# Patient Record
Sex: Female | Born: 1965 | Race: Black or African American | Hispanic: No | Smoking: Former smoker
Health system: Southern US, Community
[De-identification: ages and names within clinical notes are randomized; demographics above are authoritative.]

## PROBLEM LIST (undated history)

## (undated) ENCOUNTER — Inpatient Hospital Stay (INDEPENDENT_AMBULATORY_CARE_PROVIDER_SITE_OTHER): Payer: Medicare Other | Admitting: Podiatry

## (undated) ENCOUNTER — Emergency Department (HOSPITAL_COMMUNITY): Admission: EM | Payer: Medicare Other | Source: Home / Self Care

## (undated) DIAGNOSIS — Z87442 Personal history of urinary calculi: Secondary | ICD-10-CM

## (undated) DIAGNOSIS — M1990 Unspecified osteoarthritis, unspecified site: Secondary | ICD-10-CM

## (undated) DIAGNOSIS — I10 Essential (primary) hypertension: Secondary | ICD-10-CM

## (undated) DIAGNOSIS — K219 Gastro-esophageal reflux disease without esophagitis: Secondary | ICD-10-CM

## (undated) HISTORY — PX: ENDOMETRIAL ABLATION: SHX621

## (undated) HISTORY — PX: KNEE ARTHROSCOPY: SUR90

## (undated) HISTORY — PX: GASTROPLASTY DUODENAL SWITCH: SHX1699

## (undated) HISTORY — PX: TUBAL LIGATION: SHX77

---

## 2012-02-27 DIAGNOSIS — M51379 Other intervertebral disc degeneration, lumbosacral region without mention of lumbar back pain or lower extremity pain: Secondary | ICD-10-CM | POA: Insufficient documentation

## 2012-02-27 DIAGNOSIS — M5137 Other intervertebral disc degeneration, lumbosacral region: Secondary | ICD-10-CM | POA: Insufficient documentation

## 2014-06-10 DIAGNOSIS — M545 Low back pain, unspecified: Secondary | ICD-10-CM | POA: Insufficient documentation

## 2014-06-10 DIAGNOSIS — I10 Essential (primary) hypertension: Secondary | ICD-10-CM | POA: Insufficient documentation

## 2014-06-10 DIAGNOSIS — M25569 Pain in unspecified knee: Secondary | ICD-10-CM | POA: Insufficient documentation

## 2014-06-10 DIAGNOSIS — R5383 Other fatigue: Secondary | ICD-10-CM | POA: Insufficient documentation

## 2015-09-25 DIAGNOSIS — G4733 Obstructive sleep apnea (adult) (pediatric): Secondary | ICD-10-CM | POA: Insufficient documentation

## 2015-11-25 DIAGNOSIS — Z9884 Bariatric surgery status: Secondary | ICD-10-CM | POA: Insufficient documentation

## 2015-12-29 ENCOUNTER — Emergency Department (HOSPITAL_COMMUNITY): Payer: Medicare Other

## 2015-12-29 ENCOUNTER — Emergency Department (HOSPITAL_COMMUNITY)
Admission: EM | Admit: 2015-12-29 | Discharge: 2015-12-29 | Disposition: A | Discharge status: Home / Self Care | Payer: Medicare Other | Attending: Emergency Medicine | Admitting: Emergency Medicine

## 2015-12-29 ENCOUNTER — Encounter (HOSPITAL_COMMUNITY): Payer: Self-pay

## 2015-12-29 DIAGNOSIS — Z79899 Other long term (current) drug therapy: Secondary | ICD-10-CM | POA: Diagnosis not present

## 2015-12-29 DIAGNOSIS — N950 Postmenopausal bleeding: Secondary | ICD-10-CM | POA: Diagnosis not present

## 2015-12-29 DIAGNOSIS — R197 Diarrhea, unspecified: Secondary | ICD-10-CM | POA: Diagnosis present

## 2015-12-29 DIAGNOSIS — R1031 Right lower quadrant pain: Secondary | ICD-10-CM | POA: Diagnosis not present

## 2015-12-29 DIAGNOSIS — N83201 Unspecified ovarian cyst, right side: Secondary | ICD-10-CM | POA: Diagnosis not present

## 2015-12-29 DIAGNOSIS — J111 Influenza due to unidentified influenza virus with other respiratory manifestations: Secondary | ICD-10-CM | POA: Diagnosis not present

## 2015-12-29 DIAGNOSIS — Z3202 Encounter for pregnancy test, result negative: Secondary | ICD-10-CM | POA: Insufficient documentation

## 2015-12-29 DIAGNOSIS — Z87891 Personal history of nicotine dependence: Secondary | ICD-10-CM | POA: Insufficient documentation

## 2015-12-29 DIAGNOSIS — R69 Illness, unspecified: Secondary | ICD-10-CM

## 2015-12-29 LAB — URINALYSIS, ROUTINE W REFLEX MICROSCOPIC
BILIRUBIN URINE: NEGATIVE
GLUCOSE, UA: NEGATIVE mg/dL
KETONES UR: NEGATIVE mg/dL
Leukocytes, UA: NEGATIVE
NITRITE: NEGATIVE
PH: 8 (ref 5.0–8.0)
Protein, ur: NEGATIVE mg/dL
Specific Gravity, Urine: 1.013 (ref 1.005–1.030)

## 2015-12-29 LAB — URINE MICROSCOPIC-ADD ON

## 2015-12-29 LAB — COMPREHENSIVE METABOLIC PANEL
ALBUMIN: 3.5 g/dL (ref 3.5–5.0)
ALK PHOS: 61 U/L (ref 38–126)
ALT: 25 U/L (ref 14–54)
AST: 39 U/L (ref 15–41)
Anion gap: 8 (ref 5–15)
BILIRUBIN TOTAL: 1 mg/dL (ref 0.3–1.2)
BUN: 7 mg/dL (ref 6–20)
CO2: 25 mmol/L (ref 22–32)
CREATININE: 0.57 mg/dL (ref 0.44–1.00)
Calcium: 8.7 mg/dL — ABNORMAL LOW (ref 8.9–10.3)
Chloride: 103 mmol/L (ref 101–111)
GFR calc Af Amer: >60 mL/min (ref >60)
GFR calc non Af Amer: >60 mL/min (ref >60)
GLUCOSE: 89 mg/dL (ref 65–99)
POTASSIUM: 3.9 mmol/L (ref 3.5–5.1)
Sodium: 136 mmol/L (ref 135–145)
TOTAL PROTEIN: 7.9 g/dL (ref 6.5–8.1)

## 2015-12-29 LAB — CBC
HEMATOCRIT: 37.8 % (ref 36.0–46.0)
Hemoglobin: 12.5 g/dL (ref 12.0–15.0)
MCH: 26.4 pg (ref 26.0–34.0)
MCHC: 33.1 g/dL (ref 30.0–36.0)
MCV: 79.9 fL (ref 78.0–100.0)
Platelets: 230 10*3/uL (ref 150–400)
RBC: 4.73 MIL/uL (ref 3.87–5.11)
RDW: 16.9 % — AB (ref 11.5–15.5)
WBC: 5.9 10*3/uL (ref 4.0–10.5)

## 2015-12-29 LAB — I-STAT BETA HCG BLOOD, ED (MC, WL, AP ONLY): I-stat hCG, quantitative: <5 m[IU]/mL (ref <5)

## 2015-12-29 LAB — POC OCCULT BLOOD, ED: FECAL OCCULT BLD: NEGATIVE

## 2015-12-29 LAB — LIPASE, BLOOD: Lipase: 33 U/L (ref 11–51)

## 2015-12-29 MED ORDER — IOHEXOL 300 MG/ML  SOLN
100.0000 mL | Freq: Once | INTRAMUSCULAR | Status: AC | PRN
  Administered 2015-12-29: 100 mL via INTRAVENOUS

## 2015-12-29 MED ORDER — ONDANSETRON HCL 4 MG PO TABS
4.0000 mg | ORAL_TABLET | Freq: Three times a day (TID) | ORAL | Status: DC | PRN
Start: 2015-12-29 — End: 2017-08-22

## 2015-12-29 MED ORDER — ACETAMINOPHEN 325 MG PO TABS
650.0000 mg | ORAL_TABLET | Freq: Once | ORAL | Status: AC
  Administered 2015-12-29: 650 mg via ORAL
  Filled 2015-12-29: qty 2

## 2015-12-29 MED ORDER — SODIUM CHLORIDE 0.9 % IV BOLUS (SEPSIS)
1000.0000 mL | Freq: Once | INTRAVENOUS | Status: AC
  Administered 2015-12-29: 1000 mL via INTRAVENOUS

## 2015-12-29 MED ORDER — ONDANSETRON HCL 4 MG/2ML IJ SOLN
4.0000 mg | Freq: Once | INTRAMUSCULAR | Status: AC
  Administered 2015-12-29: 4 mg via INTRAVENOUS
  Filled 2015-12-29: qty 2

## 2015-12-29 MED ORDER — OSELTAMIVIR PHOSPHATE 75 MG PO CAPS: 75.0000 mg | ORAL_CAPSULE | Freq: Two times a day (BID) | ORAL | Status: DC

## 2015-12-29 MED ORDER — IOHEXOL 300 MG/ML  SOLN
25.0000 mL | Freq: Once | INTRAMUSCULAR | Status: AC | PRN
  Administered 2015-12-29: 25 mL via ORAL

## 2015-12-29 MED ORDER — MORPHINE SULFATE (PF) 4 MG/ML IV SOLN: 4.0000 mg | Freq: Once | INTRAVENOUS | Status: DC

## 2015-12-29 NOTE — ED Provider Notes (Signed)
CSN: [id number]     Arrival date & time 12/29/15  1052 History   First MD Initiated Contact with Patient 12/29/15 1129     Chief Complaint  Patient presents with  . Vaginal Bleeding  . Diarrhea     (Consider location/radiation/quality/duration/timing/severity/associated sxs/prior Treatment) HPI 50 year old female who presents with vaginal bleeding and diarrhea. History of gastroplasty duodenal switch in 09/2015 in [place]. She was helping a friend move three days ago and 2 days ago developed cough, subj fever, myalgias, congestion, sore throat. Then developed loose stool abdominal pain yesterday. Noted having vaginal bleeding yesterday as well. No rectal bleeding or hematochezia, no dysuria or frequency. Pain primarily over right lower abdomen she states. With nausea and one episode of vomiting. Unknown sick contacts. No recent travel. No chest pain or dyspnea.   History reviewed. No pertinent past medical history. Past Surgical History  Procedure Laterality Date  . Gastroplasty duodenal switch    . Knee arthroscopy    . Endometrial ablation    . Tubal ligation     History reviewed. No pertinent family history. Social History  Substance Use Topics  . Smoking status: Former Games developer  . Smokeless tobacco: None  . Alcohol Use: No   OB History    No data available     Review of Systems 10/14 systems reviewed and are negative other than those stated in the HPI    Allergies  Sulfa antibiotics  Home Medications   Prior to Admission medications   Medication Sig Start Date End Date Taking? Authorizing Provider  acetaminophen (TYLENOL) 500 MG tablet Take 500 mg by mouth every 6 (six) hours as needed for mild pain.   Yes Historical Provider, MD  Chlorphen-Pseudoephed-APAP (THERAFLU FLU/COLD PO) Take 1 tablet by mouth 2 (two) times daily as needed (cold symptoms).   Yes Historical Provider, MD  Cholecalciferol (VITAMIN D PO) Take 1 tablet by mouth daily.   Yes Historical Provider, MD   Cyanocobalamin (VITAMIN B-12 PO) Take 1 tablet by mouth daily.   Yes Historical Provider, MD  IRON PO Take 1 tablet by mouth at bedtime.   Yes Historical Provider, MD  Multiple Vitamin (MULTIVITAMIN WITH MINERALS) TABS tablet Take 1 tablet by mouth 3 (three) times daily.   Yes Historical Provider, MD  ondansetron (ZOFRAN) 4 MG tablet Take 1 tablet (4 mg total) by mouth every 8 (eight) hours as needed for nausea or vomiting. 12/29/15   Lavera Guise, MD  oseltamivir (TAMIFLU) 75 MG capsule Take 1 capsule (75 mg total) by mouth every 12 (twelve) hours. 12/29/15   Lavera Guise, MD   BP 138/63 mmHg  Pulse 95  Temp(Src) 103 F (39.4 C) (Oral)  Resp 15  SpO2 98% Physical Exam Physical Exam  Nursing note and vitals reviewed. Constitutional: Appears tired but non-toxic and in no acute distress. Head: Normocephalic and atraumatic.  Mouth/Throat: Oropharynx is clear, but moist.  Neck: Normal range of motion. Neck supple.  Cardiovascular: Normal rate and regular rhythm.  No edema. Pulmonary/Chest: Effort normal and breath sounds normal.  Abdominal: Soft. There is minimal RLQ tenderness. There is no rebound and no guarding.  Pelvic: Normal external genitalia. Normal internal genitalia. No discharge. There is blood within the vagina, but no significant active bleeding from cervix. No cervical motion tenderness. No adnexal masses or tenderness. Musculoskeletal: Normal range of motion.  Neurological: Alert, no facial droop, fluent speech, moves all extremities symmetrically Skin: Skin is warm and dry.  Psychiatric: Cooperative  ED  Course  Procedures (including critical care time) Labs Review Labs Reviewed  COMPREHENSIVE METABOLIC PANEL - Abnormal; Notable for the following:    Calcium 8.7 (*)    All other components within normal limits  CBC - Abnormal; Notable for the following:    RDW 16.9 (*)    All other components within normal limits  URINALYSIS, ROUTINE W REFLEX MICROSCOPIC (NOT AT [facility]) -  Abnormal; Notable for the following:    APPearance CLOUDY (*)    Hgb urine dipstick LARGE (*)    All other components within normal limits  URINE MICROSCOPIC-ADD ON - Abnormal; Notable for the following:    Squamous Epithelial / LPF 0-5 (*)    Bacteria, UA RARE (*)    All other components within normal limits  LIPASE, BLOOD  I-STAT BETA HCG BLOOD, ED ([facility], [facility], [facility] ONLY)  POC OCCULT BLOOD, ED    Imaging Review Dg Chest 2 View  12/29/2015  CLINICAL DATA:  Cough.  Abdominal pain and diarrhea for 3 days EXAM: CHEST  2 VIEW COMPARISON:  None. FINDINGS: Lungs are clear. Heart is borderline enlarged with pulmonary vascularity within normal limits. No adenopathy. No bone lesions. There are surgical clips in the upper abdomen. IMPRESSION: Heart borderline enlarged.  No edema or consolidation. Electronically Signed   By: Bretta Bang III M.D.   On: 12/29/2015 13:43   Ct Abdomen Pelvis W Contrast  12/29/2015  CLINICAL DATA:  Generalized abdominal pain, soreness, diarrhea, nausea and vomiting for 3 days. Initial encounter. EXAM: CT ABDOMEN AND PELVIS WITH CONTRAST TECHNIQUE: Multidetector CT imaging of the abdomen and pelvis was performed using the standard protocol following bolus administration of intravenous contrast. CONTRAST:  100 ml OMNIPAQUE IOHEXOL 300 MG/ML SOLN, 25 ml OMNIPAQUE IOHEXOL 300 MG/ML SOLN COMPARISON:  None. FINDINGS: The lung bases are clear. No pleural or pericardial effusion. Heart size is upper normal. The patient is status post gastric bypass without evidence of complication. The stomach and small bowel are otherwise unremarkable. The colon and appendix appear normal. A cystic lesion in the right ovary measures 5.7 x 3.4 cm. The uterus and left ovary are unremarkable. The gallbladder has been removed. The liver, spleen, adrenal glands, pancreas and biliary tree are unremarkable. Two cysts are identified in each kidney. A punctate nonobstructing stone is seen in the lower pole of the  left kidney. The kidneys are otherwise unremarkable. Aortoiliac atherosclerosis without aneurysm is identified. Small fat containing umbilical hernia is seen. There is no lymphadenopathy or fluid. Facet degenerative change is seen in the lumbar spine. No worrisome bony lesion is identified. IMPRESSION: No acute abnormality or finding to explain the patient's symptoms. 5.7 x 3.4 cm right ovarian cyst. Follow-up ultrasound in 6-12 weeks is recommended further evaluation. This recommendation follows ACR consensus guidelines: White Paper of the ACR Incidental Findings Committee II on Adnexal Findings. J Am Coll Radiol [phone]. Atherosclerosis. Punctate nonobstructing stone lower pole left kidney. Small fat containing umbilical hernia. Status post gastric bypass. Electronically Signed   By: Drusilla Kanner M.D.   On: 12/29/2015 14:43   I have personally reviewed and evaluated these images and lab results as part of my medical decision-making.   EKG Interpretation None      MDM   Final diagnoses:  Influenza-like illness  Cyst of right ovary  Post-menopausal bleeding   50 year old female who presents with influenza like illness with new onset post menopausal bleeding and abdominal pain.   Does have fever while in ED, but  no other systemic signs of illness. NO tachycardia or hypotension or respiratory distress. Normal WBC and no major metabolic or electrolyte derangements. CXR without infiltrate or other processes and UA unremarkable. No antibiotics warranted as likely flu or viral illness. GI symptoms may be related to her illness, but does have mild pain in RLQ. CT was performed of abdomen in setting of recent surgery as well, and visualized. Negative for acute intraabdominal process but does not right ovarian cyst which may have caused her discomfort. Within window for tamiflu given likely flu and given prescription. Do not feel antibiotics are indicated at this time.  In regards to vaginal  bleeding, post menopausal in nature. Blood in vaginal but no significant active bleeding and hgb stable. Right ovarian cyst on CT without c/f torsion given benign abdominal exam. No other acute pelvic processes on CT. Discussed need for close gyn follow-up to rule out endometrial polyp, malignancy, or other process. Given warning signs for worsening bleeding that should prompt return to ED.   At this time appropriate for discharge home with outpatient follow-up. Strict return and follow-up instructions reviewed. She expressed understanding of all discharge instructions and felt comfortable with the plan of care.    Lavera Guise, MD 12/29/15 2106

## 2015-12-29 NOTE — Discharge Instructions (Signed)
In regards to your vaginal bleeding, please call your gynecologist tomorrow and follow-up regarding further work-up. Return if worsening bleeding or abdominal pain (> 1 pad per hour of bleeding).  Your CXR does not show pneumonia and you do not require antibiotics. You likely have flu like illness. Return for worsening symptoms, including confusion, vomiting and unable to keep down food/fluids, difficulty breathing, or any other symptoms concerning to you.  Influenza, Adult Influenza (flu) is an infection in the mouth, nose, and throat (respiratory tract) caused by a virus. The flu can make you feel very ill. Influenza spreads easily from person to person (contagious).  HOME CARE   Only take medicines as told by your doctor.  Use a cool mist humidifier to make breathing easier.  Get plenty of rest until your fever goes away. This usually takes 3 to 4 days.  Drink enough fluids to keep your pee (urine) clear or pale yellow.  Cover your mouth and nose when you cough or sneeze.  Wash your hands well to avoid spreading the flu.  Stay home from work or school until your fever has been gone for at least 1 full day.  Get a flu shot every year. GET HELP RIGHT AWAY IF:   You have trouble breathing or feel short of breath.  Your skin or nails turn blue.  You have severe neck pain or stiffness.  You have a severe headache, facial pain, or earache.  Your fever gets worse or keeps coming back.  You feel sick to your stomach (nauseous), throw up (vomit), or have watery poop (diarrhea).  You have chest pain.  You have a deep cough that gets worse, or you cough up more thick spit (mucus). MAKE SURE YOU:   Understand these instructions.  Will watch your condition.  Will get help right away if you are not doing well or get worse.   This information is not intended to replace advice given to you by your health care provider. Make sure you discuss any questions you have with your health  care provider.   Document Released: 07/25/2008 Document Revised: 11/06/2014 Document Reviewed: 01/15/2012 Elsevier Interactive Patient Education 2016 [employer].  Postmenopausal Bleeding Postmenopausal bleeding is any bleeding a woman has after she has entered into menopause. Menopause is the end of a woman's fertile years. After menopause, a woman no longer ovulates or has menstrual periods.  Postmenopausal bleeding can be caused by various things. Any type of postmenopausal bleeding, even if it appears to be a typical menstrual period, is concerning. This should be evaluated by your health care provider. Any treatment will depend on the cause of the bleeding. HOME CARE INSTRUCTIONS Monitor your condition for any changes. The following actions may help to alleviate any discomfort you are experiencing:  Avoid the use of tampons and douches as directed by your health care provider.  Change your pads frequently.  Get regular pelvic exams and Pap tests.  Keep all follow-up appointments for diagnostic tests as directed by your health care provider. SEEK MEDICAL CARE IF:   Your bleeding lasts more than 1 week.  You have abdominal pain.  You have bleeding with sexual intercourse. SEEK IMMEDIATE MEDICAL CARE IF:   You have a fever, chills, headache, dizziness, muscle aches, and bleeding.  You have severe pain with bleeding.  You are passing blood clots.  You have bleeding and need more than 1 pad an hour.  You feel faint. MAKE SURE YOU:  Understand these instructions.  Will  watch your condition.  Will get help right away if you are not doing well or get worse.   This information is not intended to replace advice given to you by your health care provider. Make sure you discuss any questions you have with your health care provider.   Document Released: 01/24/2006 Document Revised: 08/06/2013 Document Reviewed: 05/15/2013 Elsevier Interactive Patient Education [employer].

## 2015-12-29 NOTE — ED Notes (Addendum)
Pt c/o abdominal soreness and diarrhea x 3 days and vaginal bleeding x 2 days.  Pain score 8/10.  Pt reports "bariatric surgery" in December 2016 at a hospital in [facility].  Last menstrual cycle x 7 years ago.

## 2017-03-08 IMAGING — CR DG CHEST 2V
2 series · 2 of 2 positions shown · non-contrast
Comparison: None.

CLINICAL DATA: Cough.  Abdominal pain and diarrhea for 3 days

EXAM:
CHEST  2 VIEW

[x chest ap]
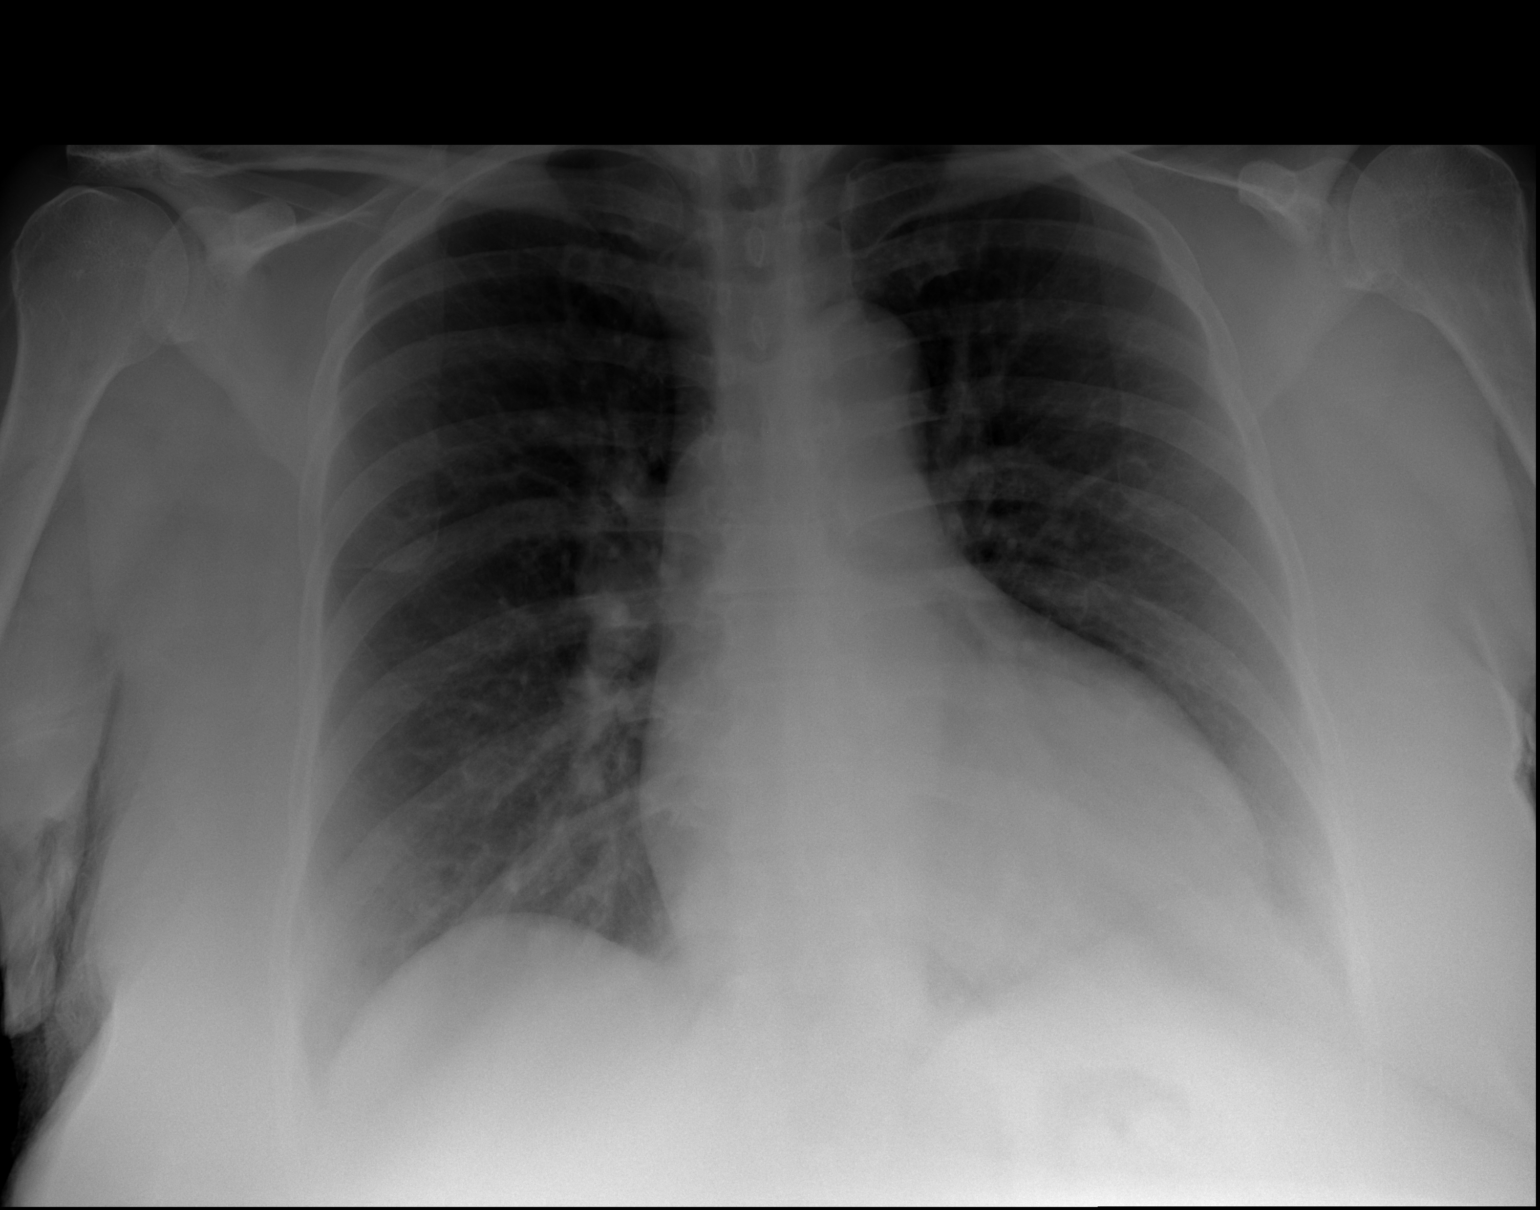

[w chest lat]
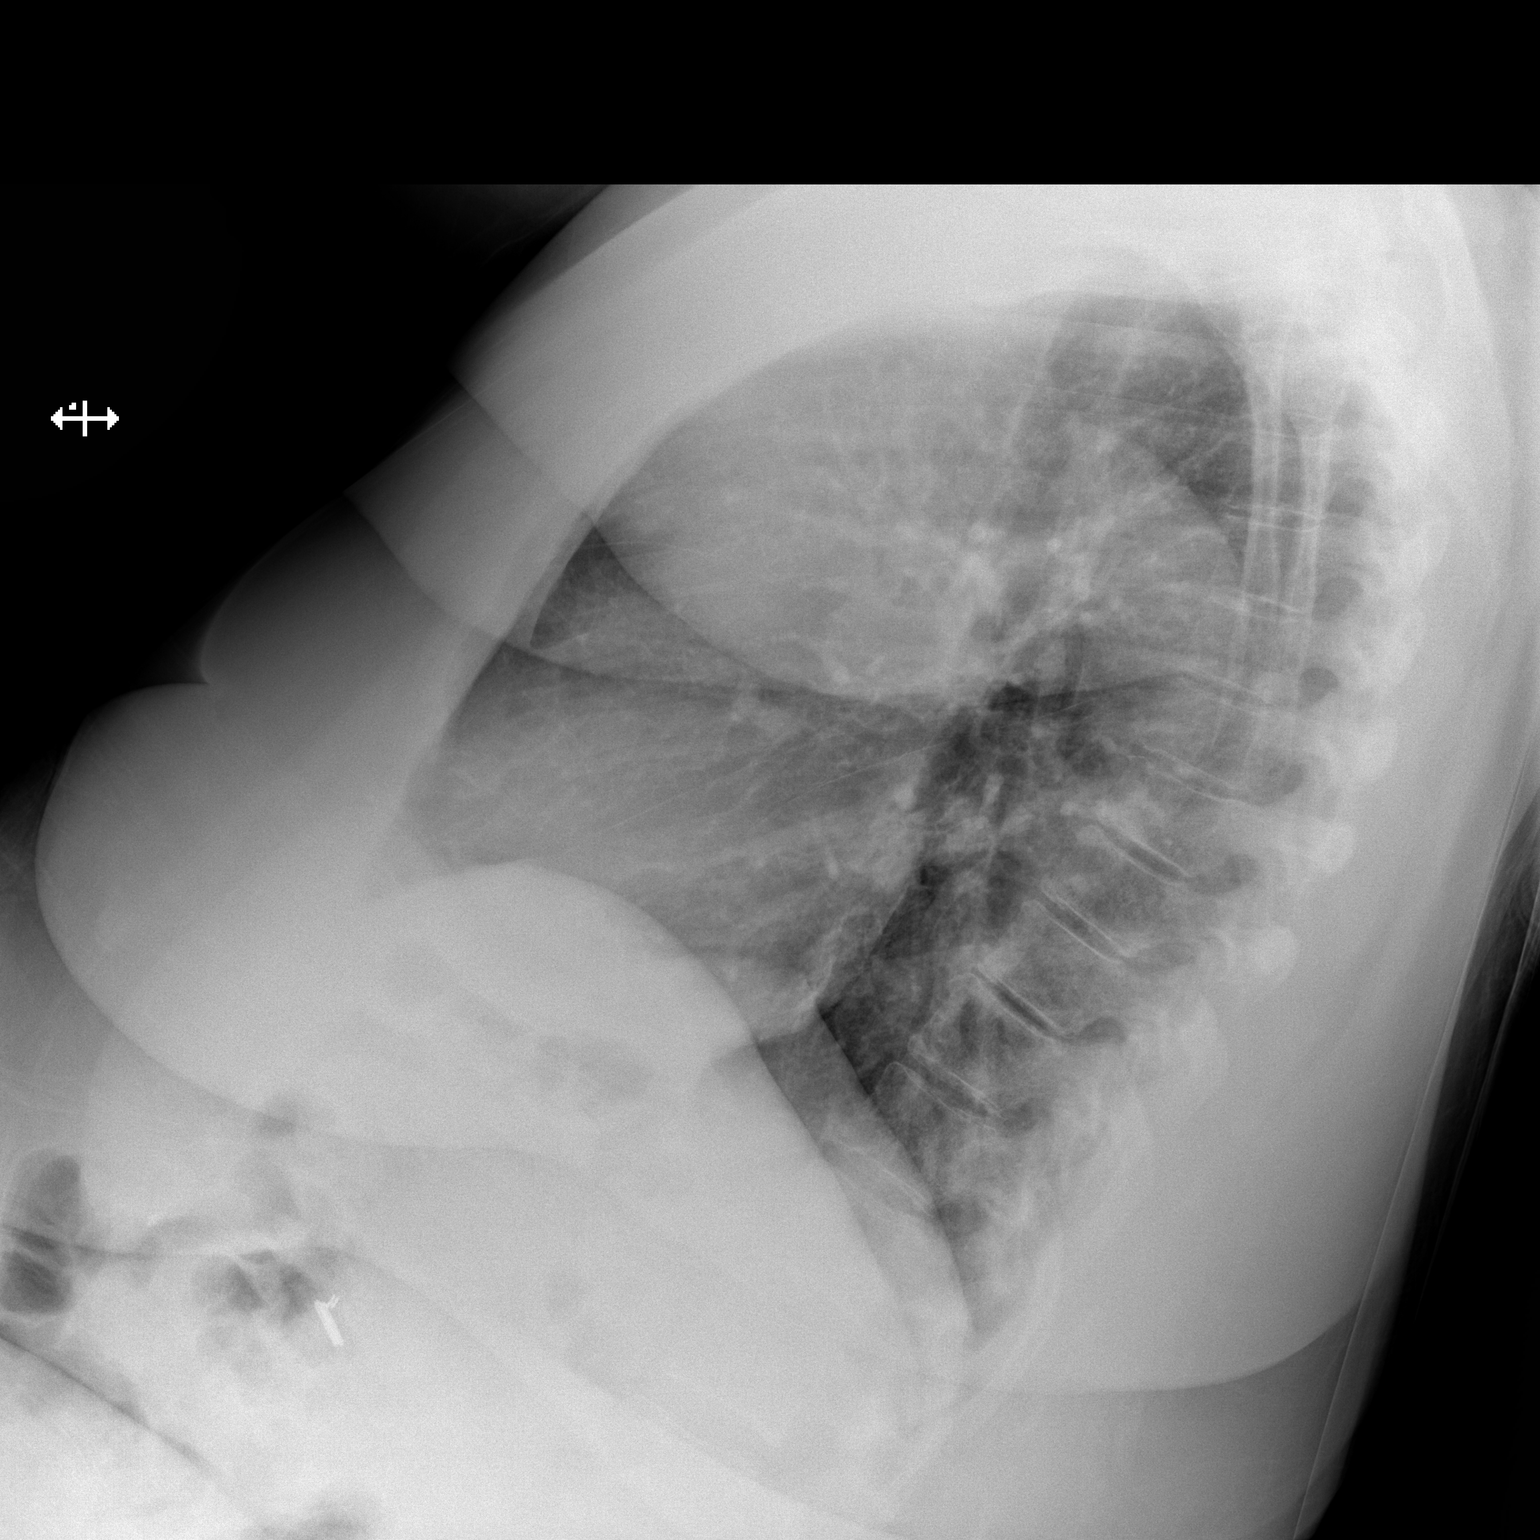

[2 of 2 positions shown; findings below may reference images not displayed]

FINDINGS: Lungs are clear. Heart is borderline enlarged with pulmonary
vascularity within normal limits. No adenopathy. No bone lesions.
There are surgical clips in the upper abdomen.
IMPRESSION: Heart borderline enlarged.  No edema or consolidation.

## 2017-08-22 ENCOUNTER — Encounter (HOSPITAL_BASED_OUTPATIENT_CLINIC_OR_DEPARTMENT_OTHER): Payer: Self-pay | Admitting: *Deleted

## 2017-08-22 ENCOUNTER — Ambulatory Visit (INDEPENDENT_AMBULATORY_CARE_PROVIDER_SITE_OTHER): Payer: Medicare Other

## 2017-08-22 ENCOUNTER — Ambulatory Visit (INDEPENDENT_AMBULATORY_CARE_PROVIDER_SITE_OTHER): Payer: Medicare Other | Admitting: Podiatry

## 2017-08-22 VITALS — BP 105/70 | HR 62 | Ht 64.0 in | Wt 214.0 lb

## 2017-08-22 DIAGNOSIS — M2042 Other hammer toe(s) (acquired), left foot: Secondary | ICD-10-CM | POA: Diagnosis not present

## 2017-08-22 DIAGNOSIS — M21619 Bunion of unspecified foot: Secondary | ICD-10-CM | POA: Diagnosis not present

## 2017-08-22 DIAGNOSIS — M79672 Pain in left foot: Principal | ICD-10-CM

## 2017-08-22 DIAGNOSIS — M2041 Other hammer toe(s) (acquired), right foot: Secondary | ICD-10-CM | POA: Diagnosis not present

## 2017-08-22 DIAGNOSIS — M21969 Unspecified acquired deformity of unspecified lower leg: Secondary | ICD-10-CM | POA: Diagnosis not present

## 2017-08-22 DIAGNOSIS — M2010 Hallux valgus (acquired), unspecified foot: Secondary | ICD-10-CM

## 2017-08-22 DIAGNOSIS — M779 Enthesopathy, unspecified: Secondary | ICD-10-CM

## 2017-08-22 DIAGNOSIS — M79671 Pain in right foot: Secondary | ICD-10-CM

## 2017-08-22 NOTE — Patient Instructions (Addendum)
Pre-Operative Instructions  Congratulations, you have decided to take an important step towards improving your quality of life.  You can be assured that the doctors and staff at [facility] will be with you every step of the way.  Here are some important things you should know:  1. Plan to be at the surgery center/hospital at least 1 (one) hour prior to your scheduled time, unless otherwise directed by the surgical center/hospital staff.  You must have a responsible adult accompany you, remain during the surgery and drive you home.  Make sure you have directions to the surgical center/hospital to ensure you arrive on time. 2. If you are having surgery at [facility] or [facility] hospitals, you will need a copy of your medical history and physical form from your family physician within one month prior to the date of surgery. We will give you a form for your primary physician to complete.  3. We make every effort to accommodate the date you request for surgery.  However, there are times where surgery dates or times have to be moved.  We will contact you as soon as possible if a change in schedule is required.   4. No aspirin/ibuprofen for one week before surgery.  If you are on aspirin, any non-steroidal anti-inflammatory medications (Mobic, Aleve, Ibuprofen) should not be taken seven (7) days prior to your surgery.  You make take Tylenol for pain prior to surgery.  5. Medications - If you are taking daily heart and blood pressure medications, seizure, reflux, allergy, asthma, anxiety, pain or diabetes medications, make sure you notify the surgery center/hospital before the day of surgery so they can tell you which medications you should take or avoid the day of surgery. 6. No food or drink after midnight the night before surgery unless directed otherwise by surgical center/hospital staff. 7. No alcoholic beverages 24-hours prior to surgery.  No smoking 24-hours prior or 24-hours after  surgery. 8. Wear loose pants or shorts. They should be loose enough to fit over bandages, boots, and casts. 9. Don't wear slip-on shoes. Sneakers are preferred. 10. Bring your boot with you to the surgery center/hospital.  Also bring crutches or a walker if your physician has prescribed it for you.  If you do not have this equipment, it will be provided for you after surgery. 11. If you have not been contacted by the surgery center/hospital by the day before your surgery, call to confirm the date and time of your surgery. 12. Leave-time from work may vary depending on the type of surgery you have.  Appropriate arrangements should be made prior to surgery with your employer. 13. Prescriptions will be provided immediately following surgery by your doctor.  Fill these as soon as possible after surgery and take the medication as directed. Pain medications will not be refilled on weekends and must be approved by the doctor. 14. Remove nail polish on the operative foot and avoid getting pedicures prior to surgery. 15. Wash the night before surgery.  The night before surgery wash the foot and leg well with water and the antibacterial soap provided. Be sure to pay special attention to beneath the toenails and in between the toes.  Wash for at least three (3) minutes. Rinse thoroughly with water and dry well with a towel.  Perform this wash unless told not to do so by your physician.  Enclosed: 1 Ice pack (please put in freezer the night before surgery)   1 Hibiclens skin cleaner     Pre-op instructions  If you have any questions regarding the instructions, please do not hesitate to call our office.  [place]: [address], [place],  [place] [address] -- [phone]  [place]: [address] [place], [place] [address] -- [phone]  [place]: [address]  [place], [place] [address] -- [phone]  [place]: [address], [address], [place], [place] [address] -- [phone]  Website:  [email or web address] 

## 2017-08-22 NOTE — Progress Notes (Signed)
Subjective:    Patient ID: Julie Douglas, female    DOB: 08/11/1966, 51 y.o.   MRN: [id number]  HPI  Chief Complaint  Patient presents with  . Foot Pain    bilateral foot pain, toes and ball of foot   51 y.o. female presents with the above complaint.  Reports pain to both feet several months duration.  Lost a significant amount of weight after bariatric surgery is been unable to exercise recently due to foot pain and has gained weight back. Endorses pain to both feet sore in nature worse when ambulating.  Reports callus on the ball of both feet. Tried padding and shoe gear change without relief. Denies diabetes.   Past Medical History:  Diagnosis Date  . Hypertension    Past Surgical History:  Procedure Laterality Date  . ENDOMETRIAL ABLATION    . GASTROPLASTY DUODENAL SWITCH    . KNEE ARTHROSCOPY    . TUBAL LIGATION      Current Outpatient Prescriptions:  .  acetaminophen (TYLENOL) 500 MG tablet, Take 500 mg by mouth every 6 (six) hours as needed for mild pain., Disp: , Rfl:  .  Cholecalciferol (VITAMIN D PO), Take 1 tablet by mouth daily., Disp: , Rfl:  .  Cyanocobalamin (VITAMIN B-12 PO), Take 1 tablet by mouth daily., Disp: , Rfl:  .  ibuprofen (ADVIL,MOTRIN) 800 MG tablet, Take 800 mg by mouth every 8 (eight) hours as needed., Disp: , Rfl:  .  IRON PO, Take 1 tablet by mouth at bedtime., Disp: , Rfl:  .  lisinopril-hydrochlorothiazide (PRINZIDE,ZESTORETIC) 10-12.5 MG tablet, Take 1 tablet by mouth daily., Disp: , Rfl:  .  Multiple Vitamin (MULTIVITAMIN WITH MINERALS) TABS tablet, Take 1 tablet by mouth 3 (three) times daily., Disp: , Rfl:  .  omeprazole (PRILOSEC) 20 MG capsule, Take 20 mg by mouth daily., Disp: , Rfl:   Allergies  Allergen Reactions  . Sulfa Antibiotics Other (See Comments)    Severe headache     Review of Systems  Constitutional: Positive for activity change.  All other systems reviewed and are negative.     Objective:   Physical Exam Vitals:   08/22/17 0901  BP: 105/70  Pulse: 62   General AA&O x3. Normal mood and affect.  Vascular Dorsalis pedis and posterior tibial pulses  present 2+ bilaterally. Capillary refill normal to all digits. Pedal hair growth normal.  Neurologic Epicritic sensation grossly intact.  Dermatologic No open lesions. Interspaces clear of maceration.  Normal skin temperature and turgor. Hyperkeratotic lesions: none bilaterally  Orthopedic: MMT 5/5 in dorsiflexion, plantarflexion, inversion, and eversion. Hallux abductovalgus deformity present - bilat Left 1st MPJ diminished range of motion. Left 1st TMT without gross hypermobility. Right 1st MPJ diminished range of motion  Right 1st TMT without gross hypermobility. Lesser digital contractures present bilaterally. 5th adductovarus bilat.   Radiographs: Taken and reviewed. Hallux abductovalgus deformity present bilat. Metatarsal parabola abnormal - elongated 2nd metatarsal bilat. 1st/2nd IMA: moderately increased bilat. HA angle increased bilat. Dorsal osteophyte formation bilat. No acute fractures or dislocations.    Assessment & Plan:  Patient was evaluated and treated and all questions answered  HAV with bunion, plantar flexed metatarsal, fifth toe adductovarus deformity bilateral -XRs reviewed as above. -Will proceed with right bunion correction via double osteotomy, 2nd metatarsal shortening osteotomy, 5th toe hammertoe correction. -All risks benefits and alternatives of the above surgery explained.  No guarantees given. -Surgery to be scheduled at a date determined by the surgical coordinator.  F/u Post-operatively

## 2017-08-22 NOTE — Progress Notes (Signed)
Patient states she has appointment with PCP this Friday, 10/26 and has the H & P form from Dr. Samuella CotaPrice.  She will have them fax it to us.

## 2017-08-24 ENCOUNTER — Telehealth: Payer: Self-pay | Admitting: *Deleted

## 2017-08-24 NOTE — Telephone Encounter (Signed)
"  I'm scheduled for surgery on next week.  I was given a physical form that I needed to have filled out by my primary care doctor.  I am here for my appointment now.  I forgot the forms you gave me.  Can you fax the forms to my doctor's office."  Yes, I can.  "The fax number is [phone]."  I faxed the forms to patient's doctor's office as requested.

## 2017-08-24 NOTE — Telephone Encounter (Signed)
"  Dr Samuella CotaPrice has a case over here on Wednesday, October 31.  I just need the orders put in on that case.  Thank you."

## 2017-08-27 ENCOUNTER — Encounter (HOSPITAL_BASED_OUTPATIENT_CLINIC_OR_DEPARTMENT_OTHER)
Admission: RE | Admit: 2017-08-27 | Discharge: 2017-08-27 | Disposition: A | Discharge status: Home / Self Care | Payer: Medicare Other | Source: Ambulatory Visit | Attending: Podiatry | Admitting: Podiatry

## 2017-08-27 DIAGNOSIS — M2041 Other hammer toe(s) (acquired), right foot: Secondary | ICD-10-CM | POA: Diagnosis not present

## 2017-08-27 DIAGNOSIS — Z87891 Personal history of nicotine dependence: Secondary | ICD-10-CM | POA: Diagnosis not present

## 2017-08-27 DIAGNOSIS — M205X1 Other deformities of toe(s) (acquired), right foot: Secondary | ICD-10-CM | POA: Diagnosis not present

## 2017-08-27 DIAGNOSIS — I10 Essential (primary) hypertension: Secondary | ICD-10-CM | POA: Diagnosis not present

## 2017-08-27 DIAGNOSIS — Z79899 Other long term (current) drug therapy: Secondary | ICD-10-CM | POA: Diagnosis not present

## 2017-08-27 DIAGNOSIS — M21611 Bunion of right foot: Secondary | ICD-10-CM | POA: Diagnosis not present

## 2017-08-27 LAB — BASIC METABOLIC PANEL
Anion gap: 7 (ref 5–15)
BUN: 14 mg/dL (ref 6–20)
CALCIUM: 8.7 mg/dL — AB (ref 8.9–10.3)
CO2: 26 mmol/L (ref 22–32)
Chloride: 110 mmol/L (ref 101–111)
Creatinine, Ser: 0.76 mg/dL (ref 0.44–1.00)
GFR calc Af Amer: >60 mL/min (ref >60)
Glucose, Bld: 89 mg/dL (ref 65–99)
POTASSIUM: 3.4 mmol/L — AB (ref 3.5–5.1)
SODIUM: 143 mmol/L (ref 135–145)

## 2017-08-29 ENCOUNTER — Telehealth: Payer: Self-pay | Admitting: *Deleted

## 2017-08-29 ENCOUNTER — Ambulatory Visit (HOSPITAL_BASED_OUTPATIENT_CLINIC_OR_DEPARTMENT_OTHER): Payer: Medicare Other | Admitting: Anesthesiology

## 2017-08-29 ENCOUNTER — Encounter (HOSPITAL_BASED_OUTPATIENT_CLINIC_OR_DEPARTMENT_OTHER)
Admission: RE | Disposition: A | Discharge status: Home / Self Care | Payer: Self-pay | Source: Ambulatory Visit | Attending: Podiatry

## 2017-08-29 ENCOUNTER — Encounter (HOSPITAL_BASED_OUTPATIENT_CLINIC_OR_DEPARTMENT_OTHER): Payer: Self-pay | Admitting: Anesthesiology

## 2017-08-29 ENCOUNTER — Ambulatory Visit (HOSPITAL_BASED_OUTPATIENT_CLINIC_OR_DEPARTMENT_OTHER)
Admission: RE | Admit: 2017-08-29 | Discharge: 2017-08-29 | Disposition: A | Discharge status: Home / Self Care | Payer: Medicare Other | Source: Ambulatory Visit | Attending: Podiatry | Admitting: Podiatry

## 2017-08-29 DIAGNOSIS — M2041 Other hammer toe(s) (acquired), right foot: Secondary | ICD-10-CM | POA: Insufficient documentation

## 2017-08-29 DIAGNOSIS — M2011 Hallux valgus (acquired), right foot: Secondary | ICD-10-CM | POA: Diagnosis not present

## 2017-08-29 DIAGNOSIS — M21611 Bunion of right foot: Secondary | ICD-10-CM | POA: Diagnosis not present

## 2017-08-29 DIAGNOSIS — I10 Essential (primary) hypertension: Secondary | ICD-10-CM | POA: Diagnosis not present

## 2017-08-29 DIAGNOSIS — M7751 Other enthesopathy of right foot: Secondary | ICD-10-CM | POA: Diagnosis not present

## 2017-08-29 DIAGNOSIS — M205X1 Other deformities of toe(s) (acquired), right foot: Secondary | ICD-10-CM | POA: Insufficient documentation

## 2017-08-29 DIAGNOSIS — Z79899 Other long term (current) drug therapy: Secondary | ICD-10-CM | POA: Insufficient documentation

## 2017-08-29 DIAGNOSIS — Z87891 Personal history of nicotine dependence: Secondary | ICD-10-CM | POA: Insufficient documentation

## 2017-08-29 HISTORY — DX: Essential (primary) hypertension: I10

## 2017-08-29 HISTORY — PX: HAMMER TOE SURGERY: SHX385

## 2017-08-29 HISTORY — PX: METATARSAL OSTEOTOMY: SHX1641

## 2017-08-29 SURGERY — OSTEOTOMY, METATARSAL BONE
Anesthesia: Monitor Anesthesia Care | Site: Foot | Laterality: Right

## 2017-08-29 MED ORDER — LACTATED RINGERS IV SOLN
INTRAVENOUS | Status: DC
  Administered 2017-08-29 (×2): via INTRAVENOUS

## 2017-08-29 MED ORDER — PROPOFOL 10 MG/ML IV BOLUS
INTRAVENOUS | Status: AC
  Filled 2017-08-29: qty 20

## 2017-08-29 MED ORDER — OXYCODONE HCL 5 MG PO TABS: 5.0000 mg | ORAL_TABLET | Freq: Once | ORAL | Status: DC | PRN

## 2017-08-29 MED ORDER — FENTANYL CITRATE (PF) 100 MCG/2ML IJ SOLN
INTRAMUSCULAR | Status: AC
  Filled 2017-08-29: qty 2

## 2017-08-29 MED ORDER — HYDROMORPHONE HCL 1 MG/ML IJ SOLN
0.2500 mg | INTRAMUSCULAR | Status: DC | PRN
  Administered 2017-08-29 (×2): 0.5 mg via INTRAVENOUS

## 2017-08-29 MED ORDER — LACTATED RINGERS IV SOLN
500.0000 mL | INTRAVENOUS | Status: DC
Start: 2017-08-29 — End: 2017-08-29

## 2017-08-29 MED ORDER — HYDROMORPHONE HCL 1 MG/ML IJ SOLN
INTRAMUSCULAR | Status: AC
  Filled 2017-08-29: qty 0.5

## 2017-08-29 MED ORDER — MIDAZOLAM HCL 2 MG/2ML IJ SOLN
1.0000 mg | INTRAMUSCULAR | Status: DC | PRN
  Administered 2017-08-29: 2 mg via INTRAVENOUS

## 2017-08-29 MED ORDER — ONDANSETRON HCL 4 MG/2ML IJ SOLN
INTRAMUSCULAR | Status: AC
  Filled 2017-08-29: qty 2

## 2017-08-29 MED ORDER — PROPOFOL 500 MG/50ML IV EMUL
INTRAVENOUS | Status: DC | PRN
  Administered 2017-08-29: 40 ug/kg/min via INTRAVENOUS

## 2017-08-29 MED ORDER — PROMETHAZINE HCL 25 MG/ML IJ SOLN: 6.2500 mg | INTRAMUSCULAR | Status: DC | PRN

## 2017-08-29 MED ORDER — OXYCODONE HCL 5 MG/5ML PO SOLN: 5.0000 mg | Freq: Once | ORAL | Status: DC | PRN

## 2017-08-29 MED ORDER — CEPHALEXIN 500 MG PO CAPS: 500.0000 mg | ORAL_CAPSULE | Freq: Three times a day (TID) | ORAL | 0 refills | Status: AC

## 2017-08-29 MED ORDER — CHLORHEXIDINE GLUCONATE 4 % EX LIQD: 60.0000 mL | Freq: Once | CUTANEOUS | Status: DC

## 2017-08-29 MED ORDER — MIDAZOLAM HCL 2 MG/2ML IJ SOLN
INTRAMUSCULAR | Status: AC
  Filled 2017-08-29: qty 2

## 2017-08-29 MED ORDER — FENTANYL CITRATE (PF) 100 MCG/2ML IJ SOLN
50.0000 ug | INTRAMUSCULAR | Status: AC | PRN
  Administered 2017-08-29: 25 ug via INTRAVENOUS
  Administered 2017-08-29: 100 ug via INTRAVENOUS
  Administered 2017-08-29 (×3): 25 ug via INTRAVENOUS

## 2017-08-29 MED ORDER — SCOPOLAMINE 1 MG/3DAYS TD PT72: 1.0000 | MEDICATED_PATCH | Freq: Once | TRANSDERMAL | Status: DC | PRN

## 2017-08-29 MED ORDER — OXYCODONE-ACETAMINOPHEN 10-325 MG PO TABS: 1.0000 | ORAL_TABLET | Freq: Four times a day (QID) | ORAL | 0 refills | Status: DC | PRN

## 2017-08-29 MED ORDER — PROPOFOL 10 MG/ML IV BOLUS
INTRAVENOUS | Status: DC | PRN
  Administered 2017-08-29: 150 mg via INTRAVENOUS

## 2017-08-29 MED ORDER — DEXAMETHASONE SODIUM PHOSPHATE 10 MG/ML IJ SOLN
INTRAMUSCULAR | Status: AC
  Filled 2017-08-29: qty 1

## 2017-08-29 MED ORDER — ONDANSETRON HCL 4 MG PO TABS: 4.0000 mg | ORAL_TABLET | Freq: Every day | ORAL | 1 refills | Status: DC | PRN

## 2017-08-29 MED ORDER — ONDANSETRON HCL 4 MG/2ML IJ SOLN
INTRAMUSCULAR | Status: DC | PRN
  Administered 2017-08-29: 4 mg via INTRAVENOUS

## 2017-08-29 MED ORDER — LIDOCAINE 2% (20 MG/ML) 5 ML SYRINGE
INTRAMUSCULAR | Status: DC | PRN
  Administered 2017-08-29: 50 mg via INTRAVENOUS

## 2017-08-29 MED ORDER — BUPIVACAINE HCL (PF) 0.5 % IJ SOLN
INTRAMUSCULAR | Status: AC
Start: 2017-08-29 — End: 2017-08-29
  Filled 2017-08-29: qty 30

## 2017-08-29 MED ORDER — CEFAZOLIN SODIUM-DEXTROSE 2-4 GM/100ML-% IV SOLN
2.0000 g | INTRAVENOUS | Status: AC
  Administered 2017-08-29: 2 g via INTRAVENOUS

## 2017-08-29 MED ORDER — CEFAZOLIN SODIUM-DEXTROSE 2-4 GM/100ML-% IV SOLN
INTRAVENOUS | Status: AC
  Filled 2017-08-29: qty 100

## 2017-08-29 MED ORDER — PROPOFOL 500 MG/50ML IV EMUL
INTRAVENOUS | Status: AC
Start: 2017-08-29 — End: 2017-08-29
  Filled 2017-08-29: qty 50

## 2017-08-29 MED ORDER — BUPIVACAINE HCL (PF) 0.5 % IJ SOLN
INTRAMUSCULAR | Status: DC | PRN
  Administered 2017-08-29: 40 mL

## 2017-08-29 MED ORDER — DEXAMETHASONE SODIUM PHOSPHATE 10 MG/ML IJ SOLN
INTRAMUSCULAR | Status: DC | PRN
  Administered 2017-08-29: 10 mg via INTRAVENOUS

## 2017-08-29 SURGICAL SUPPLY — 64 items
BANDAGE ACE 3X5.8 VEL STRL LF (GAUZE/BANDAGES/DRESSINGS) ×3 IMPLANT
BANDAGE ACE 4X5 VEL STRL LF (GAUZE/BANDAGES/DRESSINGS) ×3 IMPLANT
BIT DRILL 2 (BIT) ×3 IMPLANT
BLADE AVERAGE 25MMX9MM (BLADE) ×1
BLADE AVERAGE 25X9 (BLADE) ×2 IMPLANT
BLADE HEX COATED 2.75 (ELECTRODE) ×3 IMPLANT
BLADE MINI RND TIP GREEN BEAV (BLADE) IMPLANT
BLADE OSC/SAG .038X5.5 CUT EDG (BLADE) ×3 IMPLANT
BLADE SURG 15 STRL LF DISP TIS (BLADE) ×1 IMPLANT
BLADE SURG 15 STRL SS (BLADE) ×2
BNDG ESMARK 4X9 LF (GAUZE/BANDAGES/DRESSINGS) ×3 IMPLANT
BNDG GAUZE ELAST 4 BULKY (GAUZE/BANDAGES/DRESSINGS) ×3 IMPLANT
BOOT STEPPER DURA MED (SOFTGOODS) ×3 IMPLANT
CHLORAPREP W/TINT 26ML (MISCELLANEOUS) ×3 IMPLANT
CLIP EZ FIXATION 10X10X10 (Staple) ×3 IMPLANT
COUNTERSINK 1.7MM (BIT) ×3
COVER BACK TABLE 60X90IN (DRAPES) ×3 IMPLANT
CUFF TOURNIQUET SINGLE 18IN (TOURNIQUET CUFF) IMPLANT
DRAPE EXTREMITY T 121X128X90 (DRAPE) ×3 IMPLANT
DRAPE IMP U-DRAPE 54X76 (DRAPES) ×3 IMPLANT
DRAPE OEC MINIVIEW 54X84 (DRAPES) IMPLANT
DRAPE U-SHAPE 47X51 STRL (DRAPES) IMPLANT
DRSG EMULSION OIL 3X3 NADH (GAUZE/BANDAGES/DRESSINGS) ×3 IMPLANT
DRSG PAD ABDOMINAL 8X10 ST (GAUZE/BANDAGES/DRESSINGS) IMPLANT
ELECT REM PT RETURN 9FT ADLT (ELECTROSURGICAL) ×3
ELECTRODE REM PT RTRN 9FT ADLT (ELECTROSURGICAL) ×1 IMPLANT
GAUZE SPONGE 4X4 12PLY STRL (GAUZE/BANDAGES/DRESSINGS) ×3 IMPLANT
GAUZE SPONGE 4X4 16PLY XRAY LF (GAUZE/BANDAGES/DRESSINGS) IMPLANT
GAUZE XEROFORM 1X8 LF (GAUZE/BANDAGES/DRESSINGS) ×3 IMPLANT
GLOVE BIO SURGEON STRL SZ7.5 (GLOVE) ×3 IMPLANT
GLOVE BIOGEL PI IND STRL 8 (GLOVE) ×1 IMPLANT
GLOVE BIOGEL PI INDICATOR 8 (GLOVE) ×2
GOWN STRL REUS W/ TWL LRG LVL3 (GOWN DISPOSABLE) ×1 IMPLANT
GOWN STRL REUS W/TWL LRG LVL3 (GOWN DISPOSABLE) ×2
GOWN STRL REUS W/TWL XL LVL3 (GOWN DISPOSABLE) ×3 IMPLANT
K-WIRE .9 (WIRE) ×10
K-WIRE FX70X.9XTROC TIP MRK (WIRE) ×5
KWIRE FX70X.9XTROC TIP MRK (WIRE) ×5 IMPLANT
NEEDLE HYPO 25X1 1.5 SAFETY (NEEDLE) ×3 IMPLANT
NS IRRIG 1000ML POUR BTL (IV SOLUTION) ×3 IMPLANT
PACK BASIN DAY SURGERY FS (CUSTOM PROCEDURE TRAY) ×3 IMPLANT
PADDING CAST ABS 4INX4YD NS (CAST SUPPLIES) ×2
PADDING CAST ABS COTTON 4X4 ST (CAST SUPPLIES) ×1 IMPLANT
PENCIL BUTTON HOLSTER BLD 10FT (ELECTRODE) ×3 IMPLANT
SCREW COUNTERSINK 1.7MM (BIT) ×1 IMPLANT
SCREW HEADLESS 2.5X16MM (Screw) ×3 IMPLANT
SCREW HEADLESS CANN 2.5X12MM (Screw) ×3 IMPLANT
SCREW HEADLESS CANN 2.5X22MM (Screw) ×6 IMPLANT
SLEEVE SCD COMPRESS KNEE MED (MISCELLANEOUS) ×3 IMPLANT
SPONGE LAP 4X18 X RAY DECT (DISPOSABLE) ×3 IMPLANT
STOCKINETTE 6  STRL (DRAPES) ×2
STOCKINETTE 6 STRL (DRAPES) ×1 IMPLANT
STOCKINETTE SYNTHETIC 4 NONSTR (MISCELLANEOUS) ×3 IMPLANT
SUT ETHILON 4 0 PS 2 18 (SUTURE) ×6 IMPLANT
SUT MNCRL AB 3-0 PS2 18 (SUTURE) IMPLANT
SUT MNCRL AB 4-0 PS2 18 (SUTURE) IMPLANT
SUT MON AB 5-0 PS2 18 (SUTURE) IMPLANT
SUT VIC AB 3-0 FS2 27 (SUTURE) ×3 IMPLANT
SUT VICRYL 4-0 PS2 18IN ABS (SUTURE) ×3 IMPLANT
SYR BULB 3OZ (MISCELLANEOUS) ×3 IMPLANT
SYR CONTROL 10ML LL (SYRINGE) ×3 IMPLANT
TOWEL OR 17X24 6PK STRL BLUE (TOWEL DISPOSABLE) ×3 IMPLANT
UNDERPAD 30X30 (UNDERPADS AND DIAPERS) ×3 IMPLANT
YANKAUER SUCT BULB TIP NO VENT (SUCTIONS) ×3 IMPLANT

## 2017-08-29 NOTE — Anesthesia Postprocedure Evaluation (Signed)
Anesthesia Post Note  Patient: Julie Douglas  Procedure(s) Performed: DOUBLE OSTEOTOMY RIGHT FOOT, METATARSAL OSTEOMY SECOND RIGHT (Right Foot) HAMMER TOE CORRECTION FIFTH RIGHT (Right Foot)     Patient location during evaluation: PACU Anesthesia Type: MAC Level of consciousness: awake and alert Pain management: pain level controlled Vital Signs Assessment: post-procedure vital signs reviewed and stable Respiratory status: spontaneous breathing, nonlabored ventilation and respiratory function stable Cardiovascular status: blood pressure returned to baseline and stable Postop Assessment: no apparent nausea or vomiting Anesthetic complications: no    Last Vitals:  Vitals:   08/29/17 1345 08/29/17 1412  BP: (!) 112/54 138/64  Pulse: (!) 56 (!) 58  Resp: 16 18  Temp:  37 C  SpO2: 100% 100%    Last Pain:  Vitals:   08/29/17 1330  TempSrc:   PainSc: 3                  Catalina Gravel

## 2017-08-29 NOTE — Anesthesia Procedure Notes (Signed)
Procedure Name: LMA Insertion Date/Time: 08/29/2017 9:57 AM Performed by: Gar GibbonKEETON, Shya Kovatch S Pre-anesthesia Checklist: Patient identified, Emergency Drugs available, Suction available and Patient being monitored Patient Re-evaluated:Patient Re-evaluated prior to induction Oxygen Delivery Method: Circle system utilized Preoxygenation: Pre-oxygenation with 100% oxygen Induction Type: IV induction Ventilation: Mask ventilation without difficulty LMA: LMA inserted LMA Size: 4.0 Number of attempts: 1 Airway Equipment and Method: Bite block Placement Confirmation: positive ETCO2 Tube secured with: Tape Dental Injury: Teeth and Oropharynx as per pre-operative assessment

## 2017-08-29 NOTE — Op Note (Signed)
Patient Name: Julie Douglas DOB: [birth year]  MRN: [id number]  Date of Service: 08/29/17  Surgeon: Dr. Ventura SellersMichael Nakari Bracknell, DPM Assistants: None Pre-operative Diagnosis: Bunion R Foot, Metatarsal Deformity, Hammertoe, Capsulitis Post-operative Diagnosis: same Procedures:             1) R Bunionectomy with Double Osteotomy  2) R 2nd Metatarsal shortening osteotomy  3) R 5th Hammertoe Correction Pathology/Specimens:             1) None Anesthesia: LMA with Local Block Hemostasis: Anatomic Estimated Blood Loss: 50 ccs Materials:   Implant Name Type Inv. Item Serial No. Manufacturer Lot No. LRB No. Used  SCREW HEADLESS 2.5X16MM - [id number] Screw SCREW HEADLESS 2.5X16MM  [employer] STERILE ON SET Right 1  SCREW HEADLESS CANN 2.5X22MM - SSTERILE ON SET Screw SCREW HEADLESS CANN 2.5X22MM STERILE ON SET [employer]  Right 2  CLIP EZ 10X10X10 - [id number] Staple CLIP EZ 10X10X10  [employer]  Right 1  SCREW HEADLESS CANN 2.5X12MM - SSTERILE ON SET Screw SCREW HEADLESS CANN 2.5X12MM STERILE ON SET [employer]   Right 1   Medications: None Complications: None  Indications for Procedure:  This is a 51 year old female with chief complaint of right foot bunion pain.  She only has a painful underneath her second metatarsal head and pain associated with the fifth digit hammertoe.  She has failed a trial of conservative therapy and wishes to proceed with surgical intervention all risk benefits alternatives were discussed with the patient no guarantees were given.  Procedure in Detail: Patient was identified in pre-operative holding area. Formal consent was signed and the right lower extremity was marked. Patient was brought back to the operating room and placed on the operating room table in the supine position. Anesthesia was induced. The right lower extremity was prepped and draped in the usual sterile fashion. Timeout was taken to confirm patient name, laterality, and  procedure prior to incision. Attention is directed to the medial aspect of the left foot, where approximately a 6 cm linear incision is made coursing around the medial aspect of the first metatarsal and extending past the MPJ. The incision was deepened via sharp and blunt dissection being careful to retract all vital structures.The neurovascular bundle was not encountered during dissection. Dissection was carried down to the level of the 1st MPJ joint capsule. A linear incision was made in the joint capsule and the periosteum was gently freed from the bone. The fibular sesamoidal ligament was incised from the plantar aspect of the 1st metatarsal head to assist in alleviation of lateral contracture. The first metatarsal head noted to be hypertrophic in nature with prominent medial eminence with mild bone spur formation. There was noted denuding of the metatarsal head. The medial eminence was removed utilizing sagittal saw. A Sagittal saw was used to make a "Z" osteotomy of the 1st metatarsal shaft. The osteotomy was translated laterally to reduce the intermetatarsal deformity. Fluoroscopy was used to confirm adequate reduction of the deformity. The osteotomy was then fixated with three Stryker 2.5 Fixos screws. The overlying margin of bone was excised with a sagittal saw and rasped smooth. Fluoroscopy was again used to confirm positioning - adequate IM reduction was noted though with some elevation of the capital fragment. A sagittal saw was used to excise a small portion of the dorsal metatarsal head.   Attention was then directed to the proximal phalanx where an Akin osteotomy was then performed with distal lateral cortex remaining intact. A small wedge of bone  was removed via reciprocal planing. The osteotomy was then fixated with a Stryker EasyClip staple. This was checked on fluoroscopy. The great toe and first ray now laid more rectus and anatomic position compared to preoperatively. The surgical site was  then flushed with copious amounts of normal saline. An elliptical wedge of medial redundant capsule was removed. The capsule was then reapproximated and maintained using 2-0 Vicryl in running fashion. Subcutaneous tissues were reapproximated using interrupted 3-0 Vicryl. The skin is reapproximated and maintained using 4-0 nylon in horizontal mattress stitch fashion.  The first metatarsal phalangeal joint was brought through its range of motion without any noted restrictions.  Attention then directed to the second metatarsal where a linear incision was made over the second dissection was carried bluntly through skin and subcu tissues to the level of the 2nd MPJ capsule.  A linear incision was made in the capsule the capsule was gently freed from the bone the collateral ligaments were left intact a sagittal saw was used to make a osteotomy of the metatarsal head to the weightbearing surface.  Upon completion of the osteotomy the metatarsal head insulated proximally.  It was held in place with a K wire . Positioning was checked on intraoperative fluoroscopy to confirm restoration of the metatarsal parabola.  The osteotomy was then fixated with a Stryker 2.5 Fixos screw.  Was then directed to the right fifth toe where and elliptical incision was made along the fifth proximal interphalangeal joint with a wedge taken out to derotate the fifth toe.  The extensor tendon was incised and the collaterals were freed from the proximal phalanx head.  Proximal phalangeal head was visualized and a sagittal saw was used to excise a portion of the proximal phalanx head. The area then was irrigated and closed with 4-0 nylon with noted reduction of the hammertoe deformity.  The foot was then dressed with 4x4, kerlix, and ACE bandage. Patient tolerated the procedure well.  Disposition: Following a period of post-operative monitoring, patient will be transferred home.

## 2017-08-29 NOTE — Interval H&P Note (Signed)
History and Physical Interval Note:  08/29/2017 9:30 AM  Julie Douglas  has presented today for surgery, with the diagnosis of capsulitis joint, hallux abducto valgus, hallux Interphalangeus,hammertoe, plantar flex met  The various methods of treatment have been discussed with the patient and family. After consideration of risks, benefits and other options for treatment, the patient has consented to  Procedure(s): DOUBLE OSTEOTOMY RIGHT FOOT, METATARSAL OSTEOMY SECOND RIGHT (Right) HAMMER TOE CORRECTION FIFTH RIGHT (Right) as a surgical intervention .  The patient's history has been reviewed, patient examined, no change in status, stable for surgery.  I have reviewed the patient's chart and labs.  Questions were answered to the patient's satisfaction.     Evelina Bucy

## 2017-08-29 NOTE — Discharge Instructions (Signed)

## 2017-08-29 NOTE — Progress Notes (Signed)
Patient here for surgery. No H&P on chart.  Office closed now and will attempt to notify Dr Samuella CotaPrice. Patient does not have H&P with her, and is updated on delay or possible cancellation

## 2017-08-29 NOTE — Transfer of Care (Signed)
Immediate Anesthesia Transfer of Care Note  Patient: Julie LauthMarie Chai  Procedure(s) Performed: DOUBLE OSTEOTOMY RIGHT FOOT, METATARSAL OSTEOMY SECOND RIGHT (Right Foot) HAMMER TOE CORRECTION FIFTH RIGHT (Right Foot)  Patient Location: PACU  Anesthesia Type:General  Level of Consciousness: awake, oriented and patient cooperative  Airway & Oxygen Therapy: Patient Spontanous Breathing  Post-op Assessment: Report given to RN and Post -op Vital signs reviewed and stable  Post vital signs: Reviewed and stable  Last Vitals:  Vitals:   08/29/17 0925  BP: (!) 127/59  Pulse: 63  Resp: 18  Temp: 36.6 C  SpO2: 100%    Last Pain:  Vitals:   08/29/17 0925  TempSrc: Oral         Complications: No apparent anesthesia complications

## 2017-08-29 NOTE — Telephone Encounter (Signed)
I called the patient to get the name of her doctor and the phone number.  The phone number she gave me is not connecting.  I faxed another history and physical form to them.  I asked for immediate attention.

## 2017-08-29 NOTE — Brief Op Note (Signed)
08/29/2017  12:49 PM  PATIENT:  Julie Douglas  51 y.o. female  PRE-OPERATIVE DIAGNOSIS:  capsulitis joint, hallux abducto valgus, hallux Interphalangeus,hammertoe, plantar flex met  POST-OPERATIVE DIAGNOSIS:  capsulitis joint, hallux abducto valgus ,hallux, interphalangeus, hammertoe  PROCEDURE:  Procedure(s): DOUBLE OSTEOTOMY RIGHT FOOT, METATARSAL OSTEOMY SECOND RIGHT (Right) HAMMER TOE CORRECTION FIFTH RIGHT (Right)  SURGEON:  Surgeon(s) and Role:    * Evelina Bucy, DPM - Primary  PHYSICIAN ASSISTANT:   ASSISTANTS: none   ANESTHESIA:   IV sedation  EBL:  50 ccs   BLOOD ADMINISTERED:none  DRAINS: none   LOCAL MEDICATIONS USED:  MARCAINE     SPECIMEN:  No Specimen  DISPOSITION OF SPECIMEN:  N/A  COUNTS:  YES  TOURNIQUET:   Total Tourniquet Time Documented: Calf (Right) - 120 minutes Total: Calf (Right) - 120 minutes   DICTATION: .Note written in EPIC  PLAN OF CARE: Discharge to home after PACU  PATIENT DISPOSITION:  PACU - hemodynamically stable.   Delay start of Pharmacological VTE agent (>24hrs) due to surgical blood loss or risk of bleeding: not applicable

## 2017-08-29 NOTE — H&P (View-Only) (Signed)
Subjective:    Patient ID: Julie Douglas, female    DOB: 08/11/1966, 51 y.o.   MRN: [id number]  HPI  Chief Complaint  Patient presents with  . Foot Pain    bilateral foot pain, toes and ball of foot   51 y.o. female presents with the above complaint.  Reports pain to both feet several months duration.  Lost a significant amount of weight after bariatric surgery is been unable to exercise recently due to foot pain and has gained weight back. Endorses pain to both feet sore in nature worse when ambulating.  Reports callus on the ball of both feet. Tried padding and shoe gear change without relief. Denies diabetes.   Past Medical History:  Diagnosis Date  . Hypertension    Past Surgical History:  Procedure Laterality Date  . ENDOMETRIAL ABLATION    . GASTROPLASTY DUODENAL SWITCH    . KNEE ARTHROSCOPY    . TUBAL LIGATION      Current Outpatient Prescriptions:  .  acetaminophen (TYLENOL) 500 MG tablet, Take 500 mg by mouth every 6 (six) hours as needed for mild pain., Disp: , Rfl:  .  Cholecalciferol (VITAMIN D PO), Take 1 tablet by mouth daily., Disp: , Rfl:  .  Cyanocobalamin (VITAMIN B-12 PO), Take 1 tablet by mouth daily., Disp: , Rfl:  .  ibuprofen (ADVIL,MOTRIN) 800 MG tablet, Take 800 mg by mouth every 8 (eight) hours as needed., Disp: , Rfl:  .  IRON PO, Take 1 tablet by mouth at bedtime., Disp: , Rfl:  .  lisinopril-hydrochlorothiazide (PRINZIDE,ZESTORETIC) 10-12.5 MG tablet, Take 1 tablet by mouth daily., Disp: , Rfl:  .  Multiple Vitamin (MULTIVITAMIN WITH MINERALS) TABS tablet, Take 1 tablet by mouth 3 (three) times daily., Disp: , Rfl:  .  omeprazole (PRILOSEC) 20 MG capsule, Take 20 mg by mouth daily., Disp: , Rfl:   Allergies  Allergen Reactions  . Sulfa Antibiotics Other (See Comments)    Severe headache     Review of Systems  Constitutional: Positive for activity change.  All other systems reviewed and are negative.     Objective:   Physical Exam Vitals:   08/22/17 0901  BP: 105/70  Pulse: 62   General AA&O x3. Normal mood and affect.  Vascular Dorsalis pedis and posterior tibial pulses  present 2+ bilaterally. Capillary refill normal to all digits. Pedal hair growth normal.  Neurologic Epicritic sensation grossly intact.  Dermatologic No open lesions. Interspaces clear of maceration.  Normal skin temperature and turgor. Hyperkeratotic lesions: none bilaterally  Orthopedic: MMT 5/5 in dorsiflexion, plantarflexion, inversion, and eversion. Hallux abductovalgus deformity present - bilat Left 1st MPJ diminished range of motion. Left 1st TMT without gross hypermobility. Right 1st MPJ diminished range of motion  Right 1st TMT without gross hypermobility. Lesser digital contractures present bilaterally. 5th adductovarus bilat.   Radiographs: Taken and reviewed. Hallux abductovalgus deformity present bilat. Metatarsal parabola abnormal - elongated 2nd metatarsal bilat. 1st/2nd IMA: moderately increased bilat. HA angle increased bilat. Dorsal osteophyte formation bilat. No acute fractures or dislocations.    Assessment & Plan:  Patient was evaluated and treated and all questions answered  HAV with bunion, plantar flexed metatarsal, fifth toe adductovarus deformity bilateral -XRs reviewed as above. -Will proceed with right bunion correction via double osteotomy, 2nd metatarsal shortening osteotomy, 5th toe hammertoe correction. -All risks benefits and alternatives of the above surgery explained.  No guarantees given. -Surgery to be scheduled at a date determined by the surgical coordinator.  F/u Post-operatively

## 2017-08-29 NOTE — Anesthesia Preprocedure Evaluation (Signed)
Anesthesia Evaluation  Patient identified by MRN, date of birth, ID band Patient awake    Reviewed: Allergy & Precautions, NPO status , Patient's Chart, lab work & pertinent test results  Airway Mallampati: II  TM Distance: >3 FB Neck ROM: Full    Dental no notable dental hx.    Pulmonary neg pulmonary ROS, former smoker,    Pulmonary exam normal breath sounds clear to auscultation       Cardiovascular hypertension, Pt. on medications negative cardio ROS Normal cardiovascular exam Rhythm:Regular Rate:Normal     Neuro/Psych negative neurological ROS  negative psych ROS   GI/Hepatic negative GI ROS, Neg liver ROS,   Endo/Other  negative endocrine ROS  Renal/GU negative Renal ROS  negative genitourinary   Musculoskeletal negative musculoskeletal ROS (+)   Abdominal   Peds negative pediatric ROS (+)  Hematology negative hematology ROS (+)   Anesthesia Other Findings   Reproductive/Obstetrics negative OB ROS                             Anesthesia Physical Anesthesia Plan  ASA: II  Anesthesia Plan: MAC   Post-op Pain Management:    Induction: Intravenous  PONV Risk Score and Plan: 2 and Ondansetron and Midazolam  Airway Management Planned: Simple Face Mask  Additional Equipment:   Intra-op Plan:   Post-operative Plan:   Informed Consent: I have reviewed the patients History and Physical, chart, labs and discussed the procedure including the risks, benefits and alternatives for the proposed anesthesia with the patient or authorized representative who has indicated his/her understanding and acceptance.   Dental advisory given  Plan Discussed with: CRNA  Anesthesia Plan Comments:         Anesthesia Quick Evaluation

## 2017-08-30 ENCOUNTER — Telehealth: Payer: Self-pay

## 2017-08-30 ENCOUNTER — Telehealth: Payer: Self-pay | Admitting: *Deleted

## 2017-08-30 DIAGNOSIS — Z9889 Other specified postprocedural states: Secondary | ICD-10-CM

## 2017-08-30 NOTE — Telephone Encounter (Signed)
Faxed orders for knee scooter to [facility].

## 2017-08-30 NOTE — Telephone Encounter (Signed)
-----   Message from Park LiterMichael J Price, DPM sent at 08/30/2017  1:30 PM EDT ----- Can we assist patient in getting a knee scooter? She has a knee issue on her operative extremity and is due for a knee replacement. She is concerned about her knee giving out and so it was recommended she get a knee scooter to assist her post-operatively

## 2017-08-30 NOTE — Telephone Encounter (Signed)
-----   Message from Peggye FothergillAngela Trotter sent at 08/30/2017  2:27 PM EDT ----- Kearney HardHey Mykale Gandolfo, I have pulled her referral. Unfortunately Medicare doesn't cover a knee scooter. We will be in touch with pt to discuss options for private pay.   Thanks.  Angie  ----- Message ----- From: Marissa Nestle'Connell, Tarri Guilfoil D, RN Sent: 08/30/2017   2:03 PM To: Remus BlakeAngela Trotter  Angela, here's a knee scooter pt. Joya SanValery

## 2017-08-31 ENCOUNTER — Encounter (HOSPITAL_BASED_OUTPATIENT_CLINIC_OR_DEPARTMENT_OTHER): Payer: Self-pay | Admitting: Podiatry

## 2017-09-05 ENCOUNTER — Ambulatory Visit (INDEPENDENT_AMBULATORY_CARE_PROVIDER_SITE_OTHER): Payer: Medicare Other

## 2017-09-05 ENCOUNTER — Ambulatory Visit (INDEPENDENT_AMBULATORY_CARE_PROVIDER_SITE_OTHER): Payer: Medicare Other | Admitting: Podiatry

## 2017-09-05 ENCOUNTER — Encounter: Payer: Self-pay | Admitting: Podiatry

## 2017-09-05 DIAGNOSIS — M659 Synovitis and tenosynovitis, unspecified: Secondary | ICD-10-CM

## 2017-09-05 DIAGNOSIS — M2010 Hallux valgus (acquired), unspecified foot: Secondary | ICD-10-CM

## 2017-09-05 DIAGNOSIS — M21619 Bunion of unspecified foot: Secondary | ICD-10-CM

## 2017-09-05 DIAGNOSIS — Z9889 Other specified postprocedural states: Secondary | ICD-10-CM

## 2017-09-05 MED ORDER — HYDROCODONE-ACETAMINOPHEN 10-325 MG PO TABS: 1.0000 | ORAL_TABLET | ORAL | 0 refills | Status: DC | PRN

## 2017-09-12 ENCOUNTER — Encounter: Payer: Self-pay | Admitting: Podiatry

## 2017-09-12 ENCOUNTER — Ambulatory Visit (INDEPENDENT_AMBULATORY_CARE_PROVIDER_SITE_OTHER): Payer: Medicare Other

## 2017-09-12 ENCOUNTER — Ambulatory Visit (INDEPENDENT_AMBULATORY_CARE_PROVIDER_SITE_OTHER): Payer: Medicare Other | Admitting: Podiatry

## 2017-09-12 DIAGNOSIS — M2010 Hallux valgus (acquired), unspecified foot: Secondary | ICD-10-CM | POA: Diagnosis not present

## 2017-09-12 DIAGNOSIS — M21619 Bunion of unspecified foot: Secondary | ICD-10-CM | POA: Diagnosis not present

## 2017-09-12 MED ORDER — OXYCODONE-ACETAMINOPHEN 10-325 MG PO TABS: 1.0000 | ORAL_TABLET | Freq: Four times a day (QID) | ORAL | 0 refills | Status: DC | PRN

## 2017-09-19 ENCOUNTER — Ambulatory Visit (INDEPENDENT_AMBULATORY_CARE_PROVIDER_SITE_OTHER): Payer: Medicare Other | Admitting: Podiatry

## 2017-09-19 ENCOUNTER — Encounter: Payer: Medicare Other | Admitting: Podiatry

## 2017-09-19 DIAGNOSIS — Z9889 Other specified postprocedural states: Secondary | ICD-10-CM | POA: Diagnosis not present

## 2017-09-19 NOTE — Progress Notes (Signed)
  Subjective:  Patient ID: Julie Douglas, female    DOB: [birth year],  MRN: [id number]  DOS: 08/29/17 Procedure: R Scarf/Akin Bunionectomy, R 2nd Weil Osteotomy, R 5th toe hammertoe correction.  51 y.o. female returns for post-op check. Denies N/V/F/Ch. Pain is controlled with current medications.  States that the fifth toe is slightly hurting and she is also having pain on the ball of the foot.  Objective:   General AA&O x3. Normal mood and affect.  Vascular Foot warm and well perfused.  Neurologic Gross sensation intact.  Dermatologic Skin healing well without signs of infection. Skin edges well coapted without signs of infection.  Orthopedic: Tenderness to palpation noted about the surgical sites.    Assessment & Plan:  Patient was evaluated and treated and all questions answered.  S/p right scarf/Akin bunionectomy, right second Weil osteotomy, right fifth toe hammertoe -Progressing as expected post-operatively. -Sutures: removed.  Steri-Strips applied. -Compression anklet dispensed -Medications refilled: none -Foot redressed. -X-Rays at next visit to assess healing progress  Return in about 2 weeks (around 10/03/2017) for Post-op.

## 2017-09-23 NOTE — Progress Notes (Signed)
  Subjective:  Patient ID: Julie Douglas, female    DOB: Apr 20, 1966,  MRN: [id number]  No chief complaint on file.   DOS: 08/29/17 Procedure: R Scarf/Akin Bunionectomy, right second Weil osteotomy, right fifth toe hammertoe correction  51 y.o. female returns for post-op check. Denies N/V/F/Ch. Pain is controlled with current medications.  States she stepped wrong getting of the foot today but does not leave that she did anything to her surgical site.  Denies postoperative issues thus far  Objective:   General AA&O x3. Normal mood and affect.  Vascular Foot warm and well perfused.  Neurologic Gross sensation intact.  Dermatologic Skin healing well without signs of infection. Skin edges well coapted without signs of infection.  Orthopedic: Tenderness to palpation noted about the surgical site.    Assessment & Plan:  Patient was evaluated and treated and all questions answered.  S/p right bunion correction, right second metatarsal osteotomy, right fifth hammertoe correction -Progressing as expected post-operatively. -Sutures: Intact. -Medications refilled: Norco -Foot redressed.  Return in about 1 week (around 09/12/2017) for Post-op.

## 2017-09-23 NOTE — Progress Notes (Signed)
  Subjective:  Patient ID: Julie Douglas, female    DOB: October 21, 1966,  MRN: [id number]  No chief complaint on file.   DOS: 08/29/17 Procedure: R Scarf/Akin Bunionectomy, R 2nd metatarsal osteotomy, R 5th hammertoe correction  51 y.o. female returns for post-op check. Denies N/V/F/Ch. Pain is controlled with current medications.  No postop complaints this for.  Objective:   General AA&O x3. Normal mood and affect.  Vascular Foot warm and well perfused.  Neurologic Gross sensation intact.  Dermatologic Skin healing well without signs of infection. Skin edges well coapted without signs of infection.  Sutures not ready for removal  Orthopedic: Tenderness to palpation noted about the surgical site.    Assessment & Plan:  Patient was evaluated and treated and all questions answered.  S/p R bunionectomy, second metatarsal osteotomy, fifth hammertoe correction -Progressing as expected post-operatively. -Sutures: Left intact today. -Medications refilled: Percocet -Foot redressed.  Return in about 1 week (around 09/19/2017) for Post-op. and suture removal.

## 2017-09-26 ENCOUNTER — Encounter: Payer: Self-pay | Admitting: Podiatry

## 2017-09-27 ENCOUNTER — Encounter: Payer: Self-pay | Admitting: Podiatry

## 2017-10-04 ENCOUNTER — Ambulatory Visit (INDEPENDENT_AMBULATORY_CARE_PROVIDER_SITE_OTHER): Payer: Medicare Other

## 2017-10-04 ENCOUNTER — Ambulatory Visit (INDEPENDENT_AMBULATORY_CARE_PROVIDER_SITE_OTHER): Payer: Medicare Other | Admitting: Podiatry

## 2017-10-04 DIAGNOSIS — M2011 Hallux valgus (acquired), right foot: Secondary | ICD-10-CM

## 2017-10-04 DIAGNOSIS — Z9889 Other specified postprocedural states: Secondary | ICD-10-CM

## 2017-10-18 ENCOUNTER — Ambulatory Visit (INDEPENDENT_AMBULATORY_CARE_PROVIDER_SITE_OTHER): Payer: Medicare Other | Admitting: Podiatry

## 2017-10-18 ENCOUNTER — Ambulatory Visit (INDEPENDENT_AMBULATORY_CARE_PROVIDER_SITE_OTHER): Payer: Medicare Other

## 2017-10-18 DIAGNOSIS — M2010 Hallux valgus (acquired), unspecified foot: Secondary | ICD-10-CM

## 2017-10-18 DIAGNOSIS — Z9889 Other specified postprocedural states: Secondary | ICD-10-CM

## 2017-11-01 ENCOUNTER — Ambulatory Visit (INDEPENDENT_AMBULATORY_CARE_PROVIDER_SITE_OTHER): Payer: Medicare Other

## 2017-11-01 ENCOUNTER — Ambulatory Visit (INDEPENDENT_AMBULATORY_CARE_PROVIDER_SITE_OTHER): Payer: Medicare Other | Admitting: Podiatry

## 2017-11-01 DIAGNOSIS — M2011 Hallux valgus (acquired), right foot: Secondary | ICD-10-CM

## 2017-11-01 DIAGNOSIS — M21619 Bunion of unspecified foot: Secondary | ICD-10-CM

## 2017-11-01 DIAGNOSIS — Z9889 Other specified postprocedural states: Secondary | ICD-10-CM

## 2017-11-01 DIAGNOSIS — M2010 Hallux valgus (acquired), unspecified foot: Secondary | ICD-10-CM

## 2017-11-01 NOTE — Progress Notes (Signed)
  Subjective:  Patient ID: Julie Douglas, female    DOB: 1966/02/18,  MRN: [id number]  Chief Complaint  Patient presents with  . Routine Post Op    2 wk POV dos 10.31.18 double osteotomy rt; metatarsal osteotomy 2nd rt; hammertoe repair 5th rt   DOS: 08/29/17 Procedure: R Scarf/Akin Osteotomy, R 2nd Weil Osteotomy, R 5th toe Hammertoe correction.  52 y.o. female returns for post-op check. Denies N/V/F/Ch. Pain is controlled with current medications. States she is doing better. States only occasional pain in the ball of the foot unlike previously where she had pain under the entire ball of the foot.  Objective:   General AA&O x3. Normal mood and affect.  Vascular Foot warm and well perfused.  Neurologic Gross sensation intact.  Dermatologic Skin well-healed.  General xerosis to the foot.  Orthopedic: Tenderness to palpation about the hyperkeratotic lesions of the plantar foot    Assessment & Plan:  Patient was evaluated and treated and all questions answered.  S/p R Scarf/Akin Osteotomy, R 2nd Weil Osteotomy, R 5th toe Hammertoe correction.  -Progressing as expected post-operatively. -X-rays taken and reviewed without any change in alignment -Transition to surgical shoe -Refer to PT for range of motion and strengthening -Recommend urea cream for xerosis  Return in about 4 weeks (around 11/29/2017) for Post-op.

## 2017-11-01 NOTE — Progress Notes (Signed)
  Subjective:  Patient ID: Myrtice LauthMarie Pesqueira, female    DOB: July 09, 1966,  MRN: [id number]  DOS: 08/29/17 Procedure: R Scarf/Akin Bunionectomy, R 2nd Weil Osteotomy, R 5th toe hammertoe correction.  52 y.o. female returns for post-op check. Denies N/V/F/Ch. [place]  Objective:   General AA&O x3. Normal mood and affect.  Vascular Foot warm and well perfused.  Neurologic Gross sensation intact.  Dermatologic Skin well healed. Slight edema.  Orthopedic: Tenderness to palpation noted about the surgical sites.    Assessment & Plan:  Patient was evaluated and treated and all questions answered.  S/p right scarf/Akin bunionectomy, right second Weil osteotomy, right fifth toe hammertoe -Doing rather well. -XR taken today show healing of the osteotomy. Some dorsal displacement of the capital fragment noted. -Foot redressed. -X-Rays at next visit to assess healing progress   F/u in 2 weeks with new XRs.

## 2017-11-14 ENCOUNTER — Ambulatory Visit: Payer: Self-pay | Admitting: Physician Assistant

## 2017-11-14 NOTE — H&P (View-Only) (Signed)
TOTAL KNEE ADMISSION H&P  Patient is being admitted for right total knee arthroplasty.  Subjective:  Chief Complaint:right knee pain.  HPI: Julie Douglas, 52 y.o. female, has a history of pain and functional disability in the right knee due to arthritis and has failed non-surgical conservative treatments for greater than 12 weeks to includeNSAID's and/or analgesics, corticosteriod injections, viscosupplementation injections and activity modification.  Onset of symptoms was gradual, starting 6 years ago with gradually worsening course since that time. The patient noted no past surgery on the right knee(s).  Patient currently rates pain in the right knee(s) at 10 out of 10 with activity. Patient has night pain, worsening of pain with activity and weight bearing, pain that interferes with activities of daily living, pain with passive range of motion, crepitus and joint swelling.  Patient has evidence of periarticular osteophytes and joint space narrowing by imaging studies.There is no active infection.  There are no active problems to display for this patient.  Past Medical History:  Diagnosis Date  . Hypertension     Past Surgical History:  Procedure Laterality Date  . ENDOMETRIAL ABLATION    . GASTROPLASTY DUODENAL SWITCH    . HAMMER TOE SURGERY Right 08/29/2017   Procedure: HAMMER TOE CORRECTION FIFTH RIGHT;  Surgeon: Park Liter, DPM;  Location: [facility];  Service: Podiatry;  Laterality: Right;  . KNEE ARTHROSCOPY    . METATARSAL OSTEOTOMY Right 08/29/2017   Procedure: DOUBLE OSTEOTOMY RIGHT FOOT, METATARSAL OSTEOMY SECOND RIGHT;  Surgeon: Park Liter, DPM;  Location: [facility];  Service: Podiatry;  Laterality: Right;  . TUBAL LIGATION      Current Outpatient Medications  Medication Sig Dispense Refill Last Dose  . Calcium Citrate-Vitamin D (CELEBRATE CALCIUM CITRATE PO) Take 1 tablet by mouth daily.     . Cholecalciferol (D-3-5) 5000 units  capsule Take 5,000 Units by mouth daily. CELEBRATE D3 CHEWABLE.     . ferrous sulfate 325 (65 FE) MG tablet Take 325 mg by mouth 2 (two) times a week.     Marland Kitchen HYDROcodone-acetaminophen (NORCO) 10-325 MG tablet Take 1 tablet every 4 (four) hours as needed by mouth. (Patient not taking: Reported on 11/08/2017) 30 tablet 0 Not Taking at Unknown time  . ibuprofen (ADVIL,MOTRIN) 800 MG tablet Take 800 mg by mouth every 8 (eight) hours as needed (for pain.).    Past Week at Unknown time  . lisinopril-hydrochlorothiazide (PRINZIDE,ZESTORETIC) 10-12.5 MG tablet Take 1 tablet by mouth daily.   Past Week at Unknown time  . Multiple Vitamins-Minerals (CELEBRATE MULTI-COMPLETE 18) CAPS Take 1 capsule by mouth daily. CELEBRATE MULTI-ADEK CHEWABLE.     Marland Kitchen omeprazole (PRILOSEC) 20 MG capsule Take 20 mg by mouth daily before breakfast.    Past Week at Unknown time  . ondansetron (ZOFRAN) 4 MG tablet Take 1 tablet (4 mg total) by mouth daily as needed for nausea or vomiting. (Patient not taking: Reported on 11/08/2017) 30 tablet 1 Not Taking at Unknown time  . oxyCODONE-acetaminophen (PERCOCET) 10-325 MG tablet Take 1 tablet every 6 (six) hours as needed by mouth for pain. (Patient not taking: Reported on 11/08/2017) 12 tablet 0 Not Taking at Unknown time  . tiZANidine (ZANAFLEX) 4 MG tablet Take 4 mg by mouth every 8 (eight) hours as needed for muscle spasms.      No current facility-administered medications for this visit.    Allergies  Allergen Reactions  . Sulfa Antibiotics Hives and Other (See Comments)    Severe  headache    Social History   Tobacco Use  . Smoking status: Former Smoker    Last attempt to quit: 08/23/2011    Years since quitting: 6.2  . Smokeless tobacco: Never Used  Substance Use Topics  . Alcohol use: No    No family history on file.   Review of Systems  HENT: Positive for nosebleeds.   Musculoskeletal: Positive for joint pain.  All other systems reviewed and are  negative.   Objective:  Physical Exam  Constitutional: She is oriented to person, place, and time. She appears well-developed and well-nourished. No distress.  HENT:  Head: Normocephalic and atraumatic.  Nose: Nose normal.  Eyes: Conjunctivae and EOM are normal. Pupils are equal, round, and reactive to light.  Neck: Normal range of motion. Neck supple.  Cardiovascular: Normal rate, regular rhythm and intact distal pulses.  Murmur heard. Respiratory: Effort normal and breath sounds normal. No respiratory distress. She has no wheezes.  GI: Soft. Bowel sounds are normal. She exhibits no distension. There is no tenderness.  Musculoskeletal:       Right knee: She exhibits decreased range of motion and swelling. Tenderness found.  Lymphadenopathy:    She has no cervical adenopathy.  Neurological: She is alert and oriented to person, place, and time. No cranial nerve deficit.  Skin: Skin is warm and dry. No rash noted. No erythema.  Psychiatric: She has a normal mood and affect. Her behavior is normal.    Vital signs in last 24 hours: @VSRANGES @  Labs:   Estimated body mass index is 37.52 kg/m as calculated from the following:   Height as of 08/29/17: 5' 4.5" (1.638 m).   Weight as of 08/29/17: 100.7 kg (222 lb).   Imaging Review Plain radiographs demonstrate severe degenerative joint disease of the right knee(s). The overall alignment issignificant varus. The bone quality appears to be good for age and reported activity level.  Assessment/Plan:  End stage arthritis, right knee   The patient history, physical examination, clinical judgment of the provider and imaging studies are consistent with end stage degenerative joint disease of the right knee(s) and total knee arthroplasty is deemed medically necessary. The treatment options including medical management, injection therapy arthroscopy and arthroplasty were discussed at length. The risks and benefits of total knee  arthroplasty were presented and reviewed. The risks due to aseptic loosening, infection, stiffness, patella tracking problems, thromboembolic complications and other imponderables were discussed. The patient acknowledged the explanation, agreed to proceed with the plan and consent was signed. Patient is being admitted for inpatient treatment for surgery, pain control, PT, OT, prophylactic antibiotics, VTE prophylaxis, progressive ambulation and ADL's and discharge planning. The patient is planning to be discharged home with home health services

## 2017-11-14 NOTE — H&P (Signed)
TOTAL KNEE ADMISSION H&P  Patient is being admitted for right total knee arthroplasty.  Subjective:  Chief Complaint:right knee pain.  HPI: Julie Douglas, 52 y.o. female, has a history of pain and functional disability in the right knee due to arthritis and has failed non-surgical conservative treatments for greater than 12 weeks to includeNSAID's and/or analgesics, corticosteriod injections, viscosupplementation injections and activity modification.  Onset of symptoms was gradual, starting 6 years ago with gradually worsening course since that time. The patient noted no past surgery on the right knee(s).  Patient currently rates pain in the right knee(s) at 10 out of 10 with activity. Patient has night pain, worsening of pain with activity and weight bearing, pain that interferes with activities of daily living, pain with passive range of motion, crepitus and joint swelling.  Patient has evidence of periarticular osteophytes and joint space narrowing by imaging studies.There is no active infection.  There are no active problems to display for this patient.  Past Medical History:  Diagnosis Date  . Hypertension     Past Surgical History:  Procedure Laterality Date  . ENDOMETRIAL ABLATION    . GASTROPLASTY DUODENAL SWITCH    . HAMMER TOE SURGERY Right 08/29/2017   Procedure: HAMMER TOE CORRECTION FIFTH RIGHT;  Surgeon: Park Liter, DPM;  Location: [facility];  Service: Podiatry;  Laterality: Right;  . KNEE ARTHROSCOPY    . METATARSAL OSTEOTOMY Right 08/29/2017   Procedure: DOUBLE OSTEOTOMY RIGHT FOOT, METATARSAL OSTEOMY SECOND RIGHT;  Surgeon: Park Liter, DPM;  Location: [facility];  Service: Podiatry;  Laterality: Right;  . TUBAL LIGATION      Current Outpatient Medications  Medication Sig Dispense Refill Last Dose  . Calcium Citrate-Vitamin D (CELEBRATE CALCIUM CITRATE PO) Take 1 tablet by mouth daily.     . Cholecalciferol (D-3-5) 5000 units  capsule Take 5,000 Units by mouth daily. CELEBRATE D3 CHEWABLE.     . ferrous sulfate 325 (65 FE) MG tablet Take 325 mg by mouth 2 (two) times a week.     Marland Kitchen HYDROcodone-acetaminophen (NORCO) 10-325 MG tablet Take 1 tablet every 4 (four) hours as needed by mouth. (Patient not taking: Reported on 11/08/2017) 30 tablet 0 Not Taking at Unknown time  . ibuprofen (ADVIL,MOTRIN) 800 MG tablet Take 800 mg by mouth every 8 (eight) hours as needed (for pain.).    Past Week at Unknown time  . lisinopril-hydrochlorothiazide (PRINZIDE,ZESTORETIC) 10-12.5 MG tablet Take 1 tablet by mouth daily.   Past Week at Unknown time  . Multiple Vitamins-Minerals (CELEBRATE MULTI-COMPLETE 18) CAPS Take 1 capsule by mouth daily. CELEBRATE MULTI-ADEK CHEWABLE.     Marland Kitchen omeprazole (PRILOSEC) 20 MG capsule Take 20 mg by mouth daily before breakfast.    Past Week at Unknown time  . ondansetron (ZOFRAN) 4 MG tablet Take 1 tablet (4 mg total) by mouth daily as needed for nausea or vomiting. (Patient not taking: Reported on 11/08/2017) 30 tablet 1 Not Taking at Unknown time  . oxyCODONE-acetaminophen (PERCOCET) 10-325 MG tablet Take 1 tablet every 6 (six) hours as needed by mouth for pain. (Patient not taking: Reported on 11/08/2017) 12 tablet 0 Not Taking at Unknown time  . tiZANidine (ZANAFLEX) 4 MG tablet Take 4 mg by mouth every 8 (eight) hours as needed for muscle spasms.      No current facility-administered medications for this visit.    Allergies  Allergen Reactions  . Sulfa Antibiotics Hives and Other (See Comments)    Severe  headache    Social History   Tobacco Use  . Smoking status: Former Smoker    Last attempt to quit: 08/23/2011    Years since quitting: 6.2  . Smokeless tobacco: Never Used  Substance Use Topics  . Alcohol use: No    No family history on file.   Review of Systems  HENT: Positive for nosebleeds.   Musculoskeletal: Positive for joint pain.  All other systems reviewed and are  negative.   Objective:  Physical Exam  Constitutional: She is oriented to person, place, and time. She appears well-developed and well-nourished. No distress.  HENT:  Head: Normocephalic and atraumatic.  Nose: Nose normal.  Eyes: Conjunctivae and EOM are normal. Pupils are equal, round, and reactive to light.  Neck: Normal range of motion. Neck supple.  Cardiovascular: Normal rate, regular rhythm and intact distal pulses.  Murmur heard. Respiratory: Effort normal and breath sounds normal. No respiratory distress. She has no wheezes.  GI: Soft. Bowel sounds are normal. She exhibits no distension. There is no tenderness.  Musculoskeletal:       Right knee: She exhibits decreased range of motion and swelling. Tenderness found.  Lymphadenopathy:    She has no cervical adenopathy.  Neurological: She is alert and oriented to person, place, and time. No cranial nerve deficit.  Skin: Skin is warm and dry. No rash noted. No erythema.  Psychiatric: She has a normal mood and affect. Her behavior is normal.    Vital signs in last 24 hours: @VSRANGES @  Labs:   Estimated body mass index is 37.52 kg/m as calculated from the following:   Height as of 08/29/17: 5' 4.5" (1.638 m).   Weight as of 08/29/17: 100.7 kg (222 lb).   Imaging Review Plain radiographs demonstrate severe degenerative joint disease of the right knee(s). The overall alignment issignificant varus. The bone quality appears to be good for age and reported activity level.  Assessment/Plan:  End stage arthritis, right knee   The patient history, physical examination, clinical judgment of the provider and imaging studies are consistent with end stage degenerative joint disease of the right knee(s) and total knee arthroplasty is deemed medically necessary. The treatment options including medical management, injection therapy arthroscopy and arthroplasty were discussed at length. The risks and benefits of total knee  arthroplasty were presented and reviewed. The risks due to aseptic loosening, infection, stiffness, patella tracking problems, thromboembolic complications and other imponderables were discussed. The patient acknowledged the explanation, agreed to proceed with the plan and consent was signed. Patient is being admitted for inpatient treatment for surgery, pain control, PT, OT, prophylactic antibiotics, VTE prophylaxis, progressive ambulation and ADL's and discharge planning. The patient is planning to be discharged home with home health services

## 2017-11-16 NOTE — Pre-Procedure Instructions (Signed)
Julie LauthMarie Douglas  11/16/2017      [id number] [place], [place] - [address] [place] [address] Phone: [phone] Fax: [phone]  [id number] [place], [place] - [address] [facility] [address] [address] [place] [address] Phone: [phone] Fax: [phone]    Your procedure is scheduled on November 30, 2017.  [facility] AM.  Call this number if you have problems the morning of surgery:  [phone]   Remember:  Do not eat food or drink liquids after midnight.  Take these medicines the morning of surgery with A SIP OF WATER hydrocodone-acetaminophen (norco)-if needed, omeprazole (prilosec), oxycodone-acetaminophen (percocet)-if needed, tizanidine (zanaflex)  7 days prior to surgery STOP taking any Aspirin (unless otherwise instructed by your surgeon), Aleve, Naproxen, Ibuprofen, Motrin, Advil, Goody's, BC's, all herbal medications, fish oil, and all vitamins  Continue all other medications as instructed by your physician except follow the above medication instructions before surgery  Do not wear jewelry, make-up or nail polish.  Do not wear lotions, powders, or perfumes, or deodorant.  Do not shave 48 hours prior to surgery.  Men may shave face and neck.  Do not bring valuables to the hospital.  [facility] is not responsible for any belongings or valuables.  Contacts, dentures or bridgework may not be worn into surgery.  Leave your suitcase in the car.  After surgery it may be brought to your room.  For patients admitted to the hospital, discharge time will be determined by your treatment team.  Patients discharged the day of surgery will not be allowed to drive home.   Special instructions:  [facility]- Preparing For Surgery  Before surgery, you can play an important role. Because skin is not sterile, your skin needs to be as free of germs  as possible. You can reduce the number of germs on your skin by washing with CHG (chlorahexidine gluconate) Soap before surgery.  CHG is an antiseptic cleaner which kills germs and bonds with the skin to continue killing germs even after washing.  Please do not use if you have an allergy to CHG or antibacterial soaps. If your skin becomes reddened/irritated stop using the CHG.  Do not shave (including legs and underarms) for at least 48 hours prior to first CHG shower. It is OK to shave your face.  Please follow these instructions carefully.   1. Shower the NIGHT BEFORE SURGERY and the MORNING OF SURGERY with CHG.   2. If you chose to wash your hair, wash your hair first as usual with your normal shampoo.  3. After you shampoo, rinse your hair and body thoroughly to remove the shampoo.  4. Use CHG as you would any other liquid soap. You can apply CHG directly to the skin and wash gently with a scrungie or a clean washcloth.   5. Apply the CHG Soap to your body ONLY FROM THE NECK DOWN.  Do not use on open wounds or open sores. Avoid contact with your eyes, ears, mouth and genitals (private parts). Wash Face and genitals (private parts)  with your normal soap.  6. Wash thoroughly, paying special attention to the area where your surgery will be performed.  7. Thoroughly rinse your body with warm water from the neck down.  8. DO NOT shower/wash with your normal soap after using and rinsing off the CHG Soap.  9. Pat yourself  dry with a CLEAN TOWEL.  10. Wear CLEAN PAJAMAS to bed the night before surgery, wear comfortable clothes the morning of surgery  11. Place CLEAN SHEETS on your bed the night of your first shower and DO NOT SLEEP WITH PETS.   Day of Surgery: Do not apply any deodorants/lotions. Please wear clean clothes to the hospital/surgery center.    Please read over the following fact sheets that you were given. Pain Booklet, Coughing and Deep Breathing, MRSA Information and  Surgical Site Infection Prevention

## 2017-11-18 NOTE — Progress Notes (Signed)
  Subjective:  Patient ID: Myrtice LauthMarie Manera, female    DOB: 02-08-1966,  MRN: [id number]  No chief complaint on file.   DOS: 08/29/17 Procedure: R scarf/akin bunionectomy, R 2nd Weil Osteotomy, R 5th toe hammertoe correction.  52 y.o. female returns for post-op check. Denies N/V/F/Ch.  Currently taking pain medications still having some pain in her surgical sites.  Objective:   General AA&O x3. Normal mood and affect.  Vascular Foot warm and well perfused.  Neurologic Gross sensation intact.  Dermatologic Incision well-healed.  Orthopedic: Tenderness to palpation noted about the surgical site.    Assessment & Plan:  Patient was evaluated and treated and all questions answered.  S/p right scarf Akin bunionectomy -Progressing as expected post-operatively. -Radiographs taken with no evidence of hardware failure.  Osteotomy still healing. -Medications refilled: none -Continue WB in CAM boot/  Return in about 2 weeks (around 10/18/2017) for Post-op.

## 2017-11-19 ENCOUNTER — Encounter (HOSPITAL_COMMUNITY)
Admission: RE | Admit: 2017-11-19 | Discharge: 2017-11-19 | Disposition: A | Discharge status: Home / Self Care | Payer: Medicare Other | Source: Ambulatory Visit | Attending: Orthopedic Surgery | Admitting: Orthopedic Surgery

## 2017-11-19 ENCOUNTER — Encounter (HOSPITAL_COMMUNITY): Payer: Self-pay

## 2017-11-19 ENCOUNTER — Ambulatory Visit (HOSPITAL_COMMUNITY)
Admission: RE | Admit: 2017-11-19 | Discharge: 2017-11-19 | Disposition: A | Discharge status: Home / Self Care | Payer: Medicare Other | Source: Ambulatory Visit | Attending: Physician Assistant | Admitting: Physician Assistant

## 2017-11-19 DIAGNOSIS — M1711 Unilateral primary osteoarthritis, right knee: Secondary | ICD-10-CM

## 2017-11-19 DIAGNOSIS — Z01812 Encounter for preprocedural laboratory examination: Secondary | ICD-10-CM | POA: Insufficient documentation

## 2017-11-19 HISTORY — DX: Unspecified osteoarthritis, unspecified site: M19.90

## 2017-11-19 HISTORY — DX: Gastro-esophageal reflux disease without esophagitis: K21.9

## 2017-11-19 HISTORY — DX: Personal history of urinary calculi: Z87.442

## 2017-11-19 LAB — CBC WITH DIFFERENTIAL/PLATELET
Basophils Absolute: 0 10*3/uL (ref 0.0–0.1)
Basophils Relative: 0 %
EOS PCT: 2 %
Eosinophils Absolute: 0.1 10*3/uL (ref 0.0–0.7)
HEMATOCRIT: 38.7 % (ref 36.0–46.0)
Hemoglobin: 12.8 g/dL (ref 12.0–15.0)
LYMPHS ABS: 2.5 10*3/uL (ref 0.7–4.0)
LYMPHS PCT: 30 %
MCH: 28.3 pg (ref 26.0–34.0)
MCHC: 33.1 g/dL (ref 30.0–36.0)
MCV: 85.6 fL (ref 78.0–100.0)
MONO ABS: 0.6 10*3/uL (ref 0.1–1.0)
Monocytes Relative: 7 %
NEUTROS ABS: 5 10*3/uL (ref 1.7–7.7)
Neutrophils Relative %: 61 %
PLATELETS: 225 10*3/uL (ref 150–400)
RBC: 4.52 MIL/uL (ref 3.87–5.11)
RDW: 13.8 % (ref 11.5–15.5)
WBC: 8.3 10*3/uL (ref 4.0–10.5)

## 2017-11-19 LAB — URINALYSIS, ROUTINE W REFLEX MICROSCOPIC
Bilirubin Urine: NEGATIVE
GLUCOSE, UA: NEGATIVE mg/dL
Hgb urine dipstick: NEGATIVE
KETONES UR: NEGATIVE mg/dL
Leukocytes, UA: NEGATIVE
NITRITE: NEGATIVE
PH: 6 (ref 5.0–8.0)
Protein, ur: NEGATIVE mg/dL
SPECIFIC GRAVITY, URINE: 1.006 (ref 1.005–1.030)

## 2017-11-19 LAB — PROTIME-INR
INR: 1.01
PROTHROMBIN TIME: 13.2 s (ref 11.4–15.2)

## 2017-11-19 LAB — COMPREHENSIVE METABOLIC PANEL
ALT: 25 U/L (ref 14–54)
AST: 26 U/L (ref 15–41)
Albumin: 3.4 g/dL — ABNORMAL LOW (ref 3.5–5.0)
Alkaline Phosphatase: 76 U/L (ref 38–126)
Anion gap: 10 (ref 5–15)
BILIRUBIN TOTAL: 0.6 mg/dL (ref 0.3–1.2)
BUN: 12 mg/dL (ref 6–20)
CHLORIDE: 105 mmol/L (ref 101–111)
CO2: 25 mmol/L (ref 22–32)
Calcium: 9.2 mg/dL (ref 8.9–10.3)
Creatinine, Ser: 0.71 mg/dL (ref 0.44–1.00)
Glucose, Bld: 83 mg/dL (ref 65–99)
Potassium: 3.5 mmol/L (ref 3.5–5.1)
Sodium: 140 mmol/L (ref 135–145)
TOTAL PROTEIN: 7.5 g/dL (ref 6.5–8.1)

## 2017-11-19 LAB — TYPE AND SCREEN
ABO/RH(D): A POS
Antibody Screen: NEGATIVE

## 2017-11-19 LAB — ABO/RH: ABO/RH(D): A POS

## 2017-11-19 LAB — SURGICAL PCR SCREEN
MRSA, PCR: NEGATIVE
STAPHYLOCOCCUS AUREUS: NEGATIVE

## 2017-11-19 LAB — APTT: aPTT: 27 seconds (ref 24–36)

## 2017-11-20 LAB — URINE CULTURE: Culture: <10000 — AB

## 2017-11-29 ENCOUNTER — Encounter: Payer: Medicare Other | Admitting: Podiatry

## 2017-11-29 MED ORDER — BUPIVACAINE LIPOSOME 1.3 % IJ SUSP
20.0000 mL | INTRAMUSCULAR | Status: AC
  Administered 2017-11-30: 20 mL
  Filled 2017-11-29: qty 20

## 2017-11-29 MED ORDER — TRANEXAMIC ACID 1000 MG/10ML IV SOLN
2000.0000 mg | Freq: Once | INTRAVENOUS | Status: AC
  Administered 2017-11-30: 2000 mg via TOPICAL
  Filled 2017-11-29: qty 20

## 2017-11-29 MED ORDER — SODIUM CHLORIDE 0.9 % IV SOLN
1000.0000 mg | INTRAVENOUS | Status: AC
  Administered 2017-11-30: 1000 mg via INTRAVENOUS
  Filled 2017-11-29: qty 1100

## 2017-11-29 NOTE — Anesthesia Preprocedure Evaluation (Signed)
Anesthesia Evaluation  Patient identified by MRN, date of birth, ID band Patient awake    Reviewed: Allergy & Precautions, NPO status , Patient's Chart, lab work & pertinent test results  Airway Mallampati: II  TM Distance: >3 FB Neck ROM: Full    Dental no notable dental hx.    Pulmonary neg pulmonary ROS, former smoker,    Pulmonary exam normal breath sounds clear to auscultation       Cardiovascular hypertension, Pt. on medications negative cardio ROS Normal cardiovascular exam Rhythm:Regular Rate:Normal     Neuro/Psych negative neurological ROS  negative psych ROS   GI/Hepatic negative GI ROS, Neg liver ROS,   Endo/Other  negative endocrine ROS  Renal/GU negative Renal ROS  negative genitourinary   Musculoskeletal negative musculoskeletal ROS (+)   Abdominal   Peds negative pediatric ROS (+)  Hematology negative hematology ROS (+)   Anesthesia Other Findings   Reproductive/Obstetrics negative OB ROS                             Anesthesia Physical  Anesthesia Plan  ASA: III  Anesthesia Plan: Spinal   Post-op Pain Management:    Induction: Intravenous  PONV Risk Score and Plan: 2 and Treatment may vary due to age or medical condition, Dexamethasone and Ondansetron  Airway Management Planned: Simple Face Mask  Additional Equipment:   Intra-op Plan:   Post-operative Plan:   Informed Consent: I have reviewed the patients History and Physical, chart, labs and discussed the procedure including the risks, benefits and alternatives for the proposed anesthesia with the patient or authorized representative who has indicated his/her understanding and acceptance.   Dental advisory given  Plan Discussed with: CRNA  Anesthesia Plan Comments: (  )        Anesthesia Quick Evaluation

## 2017-11-30 ENCOUNTER — Encounter (HOSPITAL_COMMUNITY): Payer: Self-pay | Admitting: *Deleted

## 2017-11-30 ENCOUNTER — Encounter (HOSPITAL_COMMUNITY)
Admission: RE | Disposition: A | Discharge status: Home Health | Payer: Self-pay | Source: Home / Self Care | Attending: Orthopedic Surgery

## 2017-11-30 ENCOUNTER — Inpatient Hospital Stay (HOSPITAL_COMMUNITY): Payer: Medicare Other | Admitting: Certified Registered"

## 2017-11-30 ENCOUNTER — Inpatient Hospital Stay (HOSPITAL_COMMUNITY)
Admission: RE | Admit: 2017-11-30 | Discharge: 2017-12-02 | DRG: 470 | Disposition: A | Discharge status: Home Health | Payer: Medicare Other | Attending: Orthopedic Surgery | Admitting: Orthopedic Surgery

## 2017-11-30 DIAGNOSIS — D62 Acute posthemorrhagic anemia: Secondary | ICD-10-CM | POA: Diagnosis not present

## 2017-11-30 DIAGNOSIS — Z882 Allergy status to sulfonamides status: Secondary | ICD-10-CM

## 2017-11-30 DIAGNOSIS — I10 Essential (primary) hypertension: Secondary | ICD-10-CM | POA: Diagnosis not present

## 2017-11-30 DIAGNOSIS — M25561 Pain in right knee: Secondary | ICD-10-CM | POA: Diagnosis present

## 2017-11-30 DIAGNOSIS — Z87891 Personal history of nicotine dependence: Secondary | ICD-10-CM

## 2017-11-30 DIAGNOSIS — M1711 Unilateral primary osteoarthritis, right knee: Secondary | ICD-10-CM | POA: Diagnosis present

## 2017-11-30 DIAGNOSIS — Z23 Encounter for immunization: Secondary | ICD-10-CM | POA: Diagnosis not present

## 2017-11-30 DIAGNOSIS — K219 Gastro-esophageal reflux disease without esophagitis: Secondary | ICD-10-CM | POA: Diagnosis present

## 2017-11-30 DIAGNOSIS — Z79899 Other long term (current) drug therapy: Secondary | ICD-10-CM | POA: Diagnosis not present

## 2017-11-30 DIAGNOSIS — Z87442 Personal history of urinary calculi: Secondary | ICD-10-CM | POA: Diagnosis not present

## 2017-11-30 HISTORY — PX: TOTAL KNEE ARTHROPLASTY: SHX125

## 2017-11-30 SURGERY — ARTHROPLASTY, KNEE, TOTAL
Anesthesia: Spinal | Site: Knee | Laterality: Right

## 2017-11-30 MED ORDER — CEFAZOLIN SODIUM-DEXTROSE 2-4 GM/100ML-% IV SOLN
2.0000 g | INTRAVENOUS | Status: AC
  Administered 2017-11-30: 2 g via INTRAVENOUS
  Filled 2017-11-30: qty 100

## 2017-11-30 MED ORDER — CHLORHEXIDINE GLUCONATE 4 % EX LIQD: 60.0000 mL | Freq: Once | CUTANEOUS | Status: DC

## 2017-11-30 MED ORDER — ACETAMINOPHEN 500 MG PO TABS
1000.0000 mg | ORAL_TABLET | Freq: Four times a day (QID) | ORAL | Status: AC
  Administered 2017-11-30 – 2017-12-01 (×3): 1000 mg via ORAL
  Filled 2017-11-30 (×4): qty 2

## 2017-11-30 MED ORDER — PHENYLEPHRINE 40 MCG/ML (10ML) SYRINGE FOR IV PUSH (FOR BLOOD PRESSURE SUPPORT)
PREFILLED_SYRINGE | INTRAVENOUS | Status: DC | PRN
  Administered 2017-11-30: 80 ug via INTRAVENOUS
  Administered 2017-11-30: 40 ug via INTRAVENOUS

## 2017-11-30 MED ORDER — ACETAMINOPHEN 325 MG PO TABS
650.0000 mg | ORAL_TABLET | ORAL | Status: DC | PRN
  Administered 2017-11-30 – 2017-12-02 (×3): 650 mg via ORAL
  Filled 2017-11-30 (×3): qty 2

## 2017-11-30 MED ORDER — APIXABAN 2.5 MG PO TABS: 2.5000 mg | ORAL_TABLET | Freq: Two times a day (BID) | ORAL | 0 refills | Status: AC

## 2017-11-30 MED ORDER — BUPIVACAINE-EPINEPHRINE (PF) 0.25% -1:200000 IJ SOLN
INTRAMUSCULAR | Status: DC | PRN
  Administered 2017-11-30: 50 mL

## 2017-11-30 MED ORDER — ONDANSETRON HCL 4 MG/2ML IJ SOLN
4.0000 mg | Freq: Four times a day (QID) | INTRAMUSCULAR | Status: DC | PRN
  Administered 2017-12-01 (×2): 4 mg via INTRAVENOUS
  Filled 2017-11-30 (×2): qty 2

## 2017-11-30 MED ORDER — PROPOFOL 10 MG/ML IV BOLUS
INTRAVENOUS | Status: DC | PRN
  Administered 2017-11-30 (×3): 20 mg via INTRAVENOUS

## 2017-11-30 MED ORDER — METOCLOPRAMIDE HCL 5 MG/ML IJ SOLN: 5.0000 mg | Freq: Three times a day (TID) | INTRAMUSCULAR | Status: DC | PRN

## 2017-11-30 MED ORDER — HYDROCHLOROTHIAZIDE 12.5 MG PO CAPS
12.5000 mg | ORAL_CAPSULE | Freq: Every day | ORAL | Status: DC
  Administered 2017-12-01 – 2017-12-02 (×2): 12.5 mg via ORAL
  Filled 2017-11-30 (×2): qty 1

## 2017-11-30 MED ORDER — VITAMIN D3 25 MCG (1000 UNIT) PO TABS
5000.0000 [IU] | ORAL_TABLET | Freq: Every day | ORAL | Status: DC
  Administered 2017-12-01 – 2017-12-02 (×2): 5000 [IU] via ORAL
  Filled 2017-11-30 (×4): qty 5

## 2017-11-30 MED ORDER — PROPOFOL 500 MG/50ML IV EMUL
INTRAVENOUS | Status: DC | PRN
  Administered 2017-11-30: 100 ug/kg/min via INTRAVENOUS

## 2017-11-30 MED ORDER — ACETAMINOPHEN 650 MG RE SUPP: 650.0000 mg | RECTAL | Status: DC | PRN

## 2017-11-30 MED ORDER — MENTHOL 3 MG MT LOZG: 1.0000 | LOZENGE | OROMUCOSAL | Status: DC | PRN

## 2017-11-30 MED ORDER — PANTOPRAZOLE SODIUM 40 MG PO TBEC
40.0000 mg | DELAYED_RELEASE_TABLET | Freq: Every day | ORAL | Status: DC
  Administered 2017-11-30 – 2017-12-02 (×3): 40 mg via ORAL
  Filled 2017-11-30 (×3): qty 1

## 2017-11-30 MED ORDER — SODIUM CHLORIDE 0.9 % IV SOLN
INTRAVENOUS | Status: DC
  Administered 2017-11-30: 15:00:00 via INTRAVENOUS

## 2017-11-30 MED ORDER — MIDAZOLAM HCL 2 MG/2ML IJ SOLN
INTRAMUSCULAR | Status: AC
  Filled 2017-11-30: qty 2

## 2017-11-30 MED ORDER — PROPOFOL 10 MG/ML IV BOLUS
INTRAVENOUS | Status: AC
  Filled 2017-11-30: qty 20

## 2017-11-30 MED ORDER — BUPIVACAINE-EPINEPHRINE 0.25% -1:200000 IJ SOLN
INTRAMUSCULAR | Status: AC
  Filled 2017-11-30: qty 1

## 2017-11-30 MED ORDER — SODIUM CHLORIDE 0.9 % IR SOLN
Status: DC | PRN
  Administered 2017-11-30: 3000 mL

## 2017-11-30 MED ORDER — LISINOPRIL 10 MG PO TABS
10.0000 mg | ORAL_TABLET | Freq: Every day | ORAL | Status: DC
  Administered 2017-12-01 – 2017-12-02 (×2): 10 mg via ORAL
  Filled 2017-11-30 (×2): qty 1

## 2017-11-30 MED ORDER — HYDROMORPHONE HCL 1 MG/ML IJ SOLN
1.0000 mg | INTRAMUSCULAR | Status: DC | PRN
  Administered 2017-11-30 – 2017-12-01 (×7): 1 mg via INTRAVENOUS
  Filled 2017-11-30 (×7): qty 1

## 2017-11-30 MED ORDER — DOCUSATE SODIUM 100 MG PO CAPS
100.0000 mg | ORAL_CAPSULE | Freq: Two times a day (BID) | ORAL | Status: DC
  Administered 2017-11-30 – 2017-12-02 (×4): 100 mg via ORAL
  Filled 2017-11-30 (×4): qty 1

## 2017-11-30 MED ORDER — FERROUS SULFATE 325 (65 FE) MG PO TABS: 325.0000 mg | ORAL_TABLET | ORAL | Status: DC

## 2017-11-30 MED ORDER — DIPHENHYDRAMINE HCL 12.5 MG/5ML PO ELIX: 12.5000 mg | ORAL_SOLUTION | ORAL | Status: DC | PRN

## 2017-11-30 MED ORDER — DEXAMETHASONE SODIUM PHOSPHATE 10 MG/ML IJ SOLN
INTRAMUSCULAR | Status: DC | PRN
  Administered 2017-11-30: 5 mg via INTRAVENOUS

## 2017-11-30 MED ORDER — CEFAZOLIN SODIUM-DEXTROSE 1-4 GM/50ML-% IV SOLN
1.0000 g | Freq: Four times a day (QID) | INTRAVENOUS | Status: AC
  Administered 2017-11-30 (×2): 1 g via INTRAVENOUS
  Filled 2017-11-30 (×2): qty 50

## 2017-11-30 MED ORDER — ONDANSETRON HCL 4 MG PO TABS: 4.0000 mg | ORAL_TABLET | Freq: Four times a day (QID) | ORAL | Status: DC | PRN

## 2017-11-30 MED ORDER — FENTANYL CITRATE (PF) 250 MCG/5ML IJ SOLN
INTRAMUSCULAR | Status: DC | PRN
  Administered 2017-11-30 (×4): 50 ug via INTRAVENOUS

## 2017-11-30 MED ORDER — FENTANYL CITRATE (PF) 100 MCG/2ML IJ SOLN: 25.0000 ug | INTRAMUSCULAR | Status: DC | PRN

## 2017-11-30 MED ORDER — FENTANYL CITRATE (PF) 250 MCG/5ML IJ SOLN
INTRAMUSCULAR | Status: AC
  Filled 2017-11-30: qty 5

## 2017-11-30 MED ORDER — TRANEXAMIC ACID 1000 MG/10ML IV SOLN
1000.0000 mg | Freq: Once | INTRAVENOUS | Status: AC
  Administered 2017-11-30: 1000 mg via INTRAVENOUS
  Filled 2017-11-30: qty 10

## 2017-11-30 MED ORDER — OXYCODONE HCL 5 MG PO TABS
10.0000 mg | ORAL_TABLET | ORAL | Status: DC | PRN
  Administered 2017-11-30 – 2017-12-02 (×12): 10 mg via ORAL
  Filled 2017-11-30 (×12): qty 2

## 2017-11-30 MED ORDER — ONDANSETRON HCL 4 MG/2ML IJ SOLN
INTRAMUSCULAR | Status: AC
  Filled 2017-11-30: qty 2

## 2017-11-30 MED ORDER — LACTATED RINGERS IV SOLN
INTRAVENOUS | Status: DC | PRN
  Administered 2017-11-30 (×2): via INTRAVENOUS

## 2017-11-30 MED ORDER — FLEET ENEMA 7-19 GM/118ML RE ENEM: 1.0000 | ENEMA | Freq: Once | RECTAL | Status: DC | PRN

## 2017-11-30 MED ORDER — MIDAZOLAM HCL 5 MG/5ML IJ SOLN
INTRAMUSCULAR | Status: DC | PRN
  Administered 2017-11-30: 2 mg via INTRAVENOUS

## 2017-11-30 MED ORDER — SODIUM CHLORIDE 0.9 % IV SOLN: INTRAVENOUS | Status: DC

## 2017-11-30 MED ORDER — PHENYLEPHRINE HCL 10 MG/ML IJ SOLN
INTRAVENOUS | Status: DC | PRN
  Administered 2017-11-30: 20 ug/min via INTRAVENOUS

## 2017-11-30 MED ORDER — LISINOPRIL-HYDROCHLOROTHIAZIDE 10-12.5 MG PO TABS: 1.0000 | ORAL_TABLET | Freq: Every day | ORAL | Status: DC

## 2017-11-30 MED ORDER — ACETAMINOPHEN 500 MG PO TABS: ORAL_TABLET | ORAL | 0 refills | Status: DC

## 2017-11-30 MED ORDER — METOCLOPRAMIDE HCL 5 MG PO TABS: 5.0000 mg | ORAL_TABLET | Freq: Three times a day (TID) | ORAL | Status: DC | PRN

## 2017-11-30 MED ORDER — ONDANSETRON HCL 4 MG/2ML IJ SOLN
INTRAMUSCULAR | Status: DC | PRN
  Administered 2017-11-30: 4 mg via INTRAVENOUS

## 2017-11-30 MED ORDER — DEXAMETHASONE SODIUM PHOSPHATE 10 MG/ML IJ SOLN
INTRAMUSCULAR | Status: AC
  Filled 2017-11-30: qty 1

## 2017-11-30 MED ORDER — OXYCODONE HCL 5 MG PO TABS: 5.0000 mg | ORAL_TABLET | ORAL | Status: DC | PRN

## 2017-11-30 MED ORDER — OXYCODONE HCL 5 MG PO TABS: ORAL_TABLET | ORAL | 0 refills | Status: DC

## 2017-11-30 MED ORDER — SENNOSIDES-DOCUSATE SODIUM 8.6-50 MG PO TABS: 1.0000 | ORAL_TABLET | Freq: Every evening | ORAL | Status: DC | PRN

## 2017-11-30 MED ORDER — APIXABAN 2.5 MG PO TABS
2.5000 mg | ORAL_TABLET | Freq: Two times a day (BID) | ORAL | Status: DC
  Administered 2017-12-01 – 2017-12-02 (×3): 2.5 mg via ORAL
  Filled 2017-11-30 (×3): qty 1

## 2017-11-30 MED ORDER — ROPIVACAINE HCL 7.5 MG/ML IJ SOLN
INTRAMUSCULAR | Status: DC | PRN
  Administered 2017-11-30: 20 mL via PERINEURAL

## 2017-11-30 MED ORDER — SODIUM CHLORIDE 0.9% FLUSH
INTRAVENOUS | Status: DC | PRN
  Administered 2017-11-30: 50 mL

## 2017-11-30 MED ORDER — BUPIVACAINE-EPINEPHRINE (PF) 0.25% -1:200000 IJ SOLN
INTRAMUSCULAR | Status: DC | PRN
  Administered 2017-11-30: 5 mL

## 2017-11-30 MED ORDER — TIZANIDINE HCL 4 MG PO TABS
4.0000 mg | ORAL_TABLET | Freq: Three times a day (TID) | ORAL | Status: DC | PRN
  Administered 2017-12-01 – 2017-12-02 (×2): 4 mg via ORAL
  Filled 2017-11-30 (×3): qty 1

## 2017-11-30 MED ORDER — PHENOL 1.4 % MT LIQD: 1.0000 | OROMUCOSAL | Status: DC | PRN

## 2017-11-30 MED ORDER — 0.9 % SODIUM CHLORIDE (POUR BTL) OPTIME
TOPICAL | Status: DC | PRN
  Administered 2017-11-30: 1000 mL

## 2017-11-30 MED ORDER — SORBITOL 70 % SOLN: 30.0000 mL | Freq: Every day | Status: DC | PRN

## 2017-11-30 SURGICAL SUPPLY — 63 items
BANDAGE ACE 4X5 VEL STRL LF (GAUZE/BANDAGES/DRESSINGS) IMPLANT
BANDAGE ACE 6X5 VEL STRL LF (GAUZE/BANDAGES/DRESSINGS) ×6 IMPLANT
BANDAGE ESMARK 6X9 LF (GAUZE/BANDAGES/DRESSINGS) ×1 IMPLANT
BLADE SAGITTAL 25.0X1.19X90 (BLADE) ×2 IMPLANT
BLADE SAGITTAL 25.0X1.19X90MM (BLADE) ×1
BLADE SAW SAG 90X13X1.27 (BLADE) ×3 IMPLANT
BNDG ESMARK 6X9 LF (GAUZE/BANDAGES/DRESSINGS) ×3
BOWL SMART MIX CTS (DISPOSABLE) ×3 IMPLANT
CAP KNEE TOTAL 3 SIGMA ×3 IMPLANT
CEMENT HV SMART SET (Cement) ×6 IMPLANT
COVER SURGICAL LIGHT HANDLE (MISCELLANEOUS) ×3 IMPLANT
CUFF TOURNIQUET SINGLE 34IN LL (TOURNIQUET CUFF) ×3 IMPLANT
CUFF TOURNIQUET SINGLE 44IN (TOURNIQUET CUFF) IMPLANT
DRAPE INCISE IOBAN 66X45 STRL (DRAPES) IMPLANT
DRAPE ORTHO SPLIT 77X108 STRL (DRAPES) ×4
DRAPE SURG ORHT 6 SPLT 77X108 (DRAPES) ×2 IMPLANT
DRAPE U-SHAPE 47X51 STRL (DRAPES) ×3 IMPLANT
DRAPE UNIVERSAL PACK (DRAPES) ×3 IMPLANT
DRSG ADAPTIC 3X8 NADH LF (GAUZE/BANDAGES/DRESSINGS) ×3 IMPLANT
DRSG PAD ABDOMINAL 8X10 ST (GAUZE/BANDAGES/DRESSINGS) ×6 IMPLANT
DURAPREP 26ML APPLICATOR (WOUND CARE) ×3 IMPLANT
ELECT REM PT RETURN 9FT ADLT (ELECTROSURGICAL) ×3
ELECTRODE REM PT RTRN 9FT ADLT (ELECTROSURGICAL) ×1 IMPLANT
EVACUATOR 1/8 PVC DRAIN (DRAIN) IMPLANT
FACESHIELD WRAPAROUND (MASK) ×3 IMPLANT
GAUZE SPONGE 4X4 12PLY STRL (GAUZE/BANDAGES/DRESSINGS) ×3 IMPLANT
GLOVE BIOGEL PI IND STRL 8 (GLOVE) ×4 IMPLANT
GLOVE BIOGEL PI INDICATOR 8 (GLOVE) ×8
GLOVE ORTHO TXT STRL SZ7.5 (GLOVE) ×3 IMPLANT
GLOVE SURG ORTHO 8.0 STRL STRW (GLOVE) ×3 IMPLANT
GOWN STRL REUS W/ TWL LRG LVL3 (GOWN DISPOSABLE) ×1 IMPLANT
GOWN STRL REUS W/ TWL XL LVL3 (GOWN DISPOSABLE) ×1 IMPLANT
GOWN STRL REUS W/TWL 2XL LVL3 (GOWN DISPOSABLE) ×3 IMPLANT
GOWN STRL REUS W/TWL LRG LVL3 (GOWN DISPOSABLE) ×2
GOWN STRL REUS W/TWL XL LVL3 (GOWN DISPOSABLE) ×2
HANDPIECE INTERPULSE COAX TIP (DISPOSABLE) ×2
HOOD PEEL AWAY FACE SHEILD DIS (HOOD) ×3 IMPLANT
IMMOBILIZER KNEE 22 UNIV (SOFTGOODS) IMPLANT
KIT BASIN OR (CUSTOM PROCEDURE TRAY) ×3 IMPLANT
KIT ROOM TURNOVER OR (KITS) ×3 IMPLANT
MANIFOLD NEPTUNE II (INSTRUMENTS) ×3 IMPLANT
NEEDLE 22X1 1/2 (OR ONLY) (NEEDLE) ×6 IMPLANT
NS IRRIG 1000ML POUR BTL (IV SOLUTION) ×3 IMPLANT
PACK TOTAL JOINT (CUSTOM PROCEDURE TRAY) ×3 IMPLANT
PACK UNIVERSAL I (CUSTOM PROCEDURE TRAY) ×3 IMPLANT
PAD ARMBOARD 7.5X6 YLW CONV (MISCELLANEOUS) ×6 IMPLANT
PAD CAST 4YDX4 CTTN HI CHSV (CAST SUPPLIES) ×1 IMPLANT
PADDING CAST COTTON 4X4 STRL (CAST SUPPLIES) ×2
PADDING CAST COTTON 6X4 STRL (CAST SUPPLIES) ×6 IMPLANT
SET HNDPC FAN SPRY TIP SCT (DISPOSABLE) ×1 IMPLANT
STAPLER VISISTAT 35W (STAPLE) ×3 IMPLANT
SUCTION FRAZIER HANDLE 10FR (MISCELLANEOUS) ×2
SUCTION TUBE FRAZIER 10FR DISP (MISCELLANEOUS) ×1 IMPLANT
SUT ETHIBOND NAB CT1 #1 30IN (SUTURE) ×9 IMPLANT
SUT VIC AB 0 CT1 27 (SUTURE) ×2
SUT VIC AB 0 CT1 27XBRD ANBCTR (SUTURE) ×1 IMPLANT
SUT VIC AB 2-0 CT1 27 (SUTURE) ×4
SUT VIC AB 2-0 CT1 TAPERPNT 27 (SUTURE) ×2 IMPLANT
SYR CONTROL 10ML LL (SYRINGE) ×6 IMPLANT
TOWEL GREEN STERILE (TOWEL DISPOSABLE) ×3 IMPLANT
TRAY CATH 16FR W/PLASTIC CATH (SET/KITS/TRAYS/PACK) IMPLANT
TRAY FOLEY W/METER SILVER 16FR (SET/KITS/TRAYS/PACK) ×3 IMPLANT
WATER STERILE IRR 1000ML POUR (IV SOLUTION) ×3 IMPLANT

## 2017-11-30 NOTE — Anesthesia Postprocedure Evaluation (Signed)
Anesthesia Post Note  Patient: Julie Douglas  Procedure(s) Performed: RIGHT TOTAL KNEE ARTHROPLASTY (Right Knee)     Patient location during evaluation: PACU Anesthesia Type: Spinal Level of consciousness: awake Pain management: satisfactory to patient Vital Signs Assessment: post-procedure vital signs reviewed and stable Respiratory status: spontaneous breathing Cardiovascular status: blood pressure returned to baseline Postop Assessment: no headache and spinal receding Anesthetic complications: no    Last Vitals:  Vitals:   11/30/17 1225 11/30/17 1255  BP: (!) 113/59 (!) 112/58  Pulse: 68 66  Resp: 19 15  Temp: 36.9 C   SpO2: 100% 100%    Last Pain:  Vitals:   11/30/17 1225  TempSrc:   PainSc: 0-No pain                 Shaquasia Caponigro EDWARD

## 2017-11-30 NOTE — Anesthesia Procedure Notes (Signed)
Anesthesia Regional Block: Adductor canal block   Pre-Anesthetic Checklist: ,, timeout performed, Correct Patient, Correct Site, Correct Laterality, Correct Procedure, Correct Position, site marked, Risks and benefits discussed, pre-op evaluation,  At surgeon's request and post-op pain management  Laterality: Right  Prep: chloraprep       Needles:   Needle Type: Echogenic Needle     Needle Length: 9cm  Needle Gauge: 21     Additional Needles:   Procedures:,,,, ultrasound used (permanent image in chart),,,,  Narrative:  Start time: 11/30/2017 6:56 AM End time: 11/30/2017 7:02 AM Injection made incrementally with aspirations every 5 mL. Anesthesiologist: Cristela BlueJackson, Karon Heckendorn, MD

## 2017-11-30 NOTE — Anesthesia Procedure Notes (Signed)
Spinal  Patient location during procedure: OR Staffing Anesthesiologist: Marcellino Fidalgo, MD Spinal Block Patient position: sitting Prep: DuraPrep Patient monitoring: heart rate, blood pressure and continuous pulse ox Approach: right paramedian Location: L3-4 Injection technique: single-shot Needle Needle type: Sprotte  Needle gauge: 24 G Needle length: 9 cm Assessment Sensory level: T4 Additional Notes Spinal Dosage in OR  .75% Bupivicaine ml       1.8        

## 2017-11-30 NOTE — Transfer of Care (Signed)
Immediate Anesthesia Transfer of Care Note  Patient: Julie Douglas  Procedure(s) Performed: RIGHT TOTAL KNEE ARTHROPLASTY (Right Knee)  Patient Location: PACU  Anesthesia Type:MAC combined with regional for post-op pain  Level of Consciousness: drowsy and patient cooperative  Airway & Oxygen Therapy: Patient Spontanous Breathing and Patient connected to face mask oxygen  Post-op Assessment: Report given to RN and Post -op Vital signs reviewed and stable  Post vital signs: Reviewed and stable  Last Vitals:  Vitals:   11/30/17 0609 11/30/17 0955  BP: (!) 96/41 (!) 129/108  Pulse: (!) 55 (!) 57  Resp: 20 12  Temp: 36.8 C 36.4 C  SpO2: 100% 100%    Last Pain:  Vitals:   11/30/17 0609  TempSrc: Oral         Complications: No apparent anesthesia complications

## 2017-11-30 NOTE — Discharge Instructions (Signed)
INSTRUCTIONS AFTER JOINT REPLACEMENT  ° °o Remove items at home which could result in a fall. This includes throw rugs or furniture in walking pathways °o ICE to the affected joint every three hours while awake for 30 minutes at a time, for at least the first 3-5 days, and then as needed for pain and swelling.  Continue to use ice for pain and swelling. You may notice swelling that will progress down to the foot and ankle.  This is normal after surgery.  Elevate your leg when you are not up walking on it.   °o Continue to use the breathing machine you got in the hospital (incentive spirometer) which will help keep your temperature down.  It is common for your temperature to cycle up and down following surgery, especially at night when you are not up moving around and exerting yourself.  The breathing machine keeps your lungs expanded and your temperature down. ° ° °DIET:  As you were doing prior to hospitalization, we recommend a well-balanced diet. ° °DRESSING / WOUND CARE / SHOWERING ° °You may change your dressing 3-5 days after surgery.  Then change the dressing every day with sterile gauze.  Please use good hand washing techniques before changing the dressing.  Do not use any lotions or creams on the incision until instructed by your surgeon. ° °ACTIVITY ° °o Increase activity slowly as tolerated, but follow the weight bearing instructions below.   °o No driving for 6 weeks or until further direction given by your physician.  You cannot drive while taking narcotics.  °o No lifting or carrying greater than 10 lbs. until further directed by your surgeon. °o Avoid periods of inactivity such as sitting longer than an hour when not asleep. This helps prevent blood clots.  °o You may return to work once you are authorized by your doctor.  ° ° ° °WEIGHT BEARING  ° °Weight bearing as tolerated with assist device (walker, cane, etc) as directed, use it as long as suggested by your surgeon or therapist, typically at  least 4-6 weeks. ° ° °EXERCISES ° °Results after joint replacement surgery are often greatly improved when you follow the exercise, range of motion and muscle strengthening exercises prescribed by your doctor. Safety measures are also important to protect the joint from further injury. Any time any of these exercises cause you to have increased pain or swelling, decrease what you are doing until you are comfortable again and then slowly increase them. If you have problems or questions, call your caregiver or physical therapist for advice.  ° °Rehabilitation is important following a joint replacement. After just a few days of immobilization, the muscles of the leg can become weakened and shrink (atrophy).  These exercises are designed to build up the tone and strength of the thigh and leg muscles and to improve motion. Often times heat used for twenty to thirty minutes before working out will loosen up your tissues and help with improving the range of motion but do not use heat for the first two weeks following surgery (sometimes heat can increase post-operative swelling).  ° °These exercises can be done on a training (exercise) mat, on the floor, on a table or on a bed. Use whatever works the best and is most comfortable for you.    Use music or television while you are exercising so that the exercises are a pleasant break in your day. This will make your life better with the exercises acting as a break   in your routine that you can look forward to.   Perform all exercises about fifteen times, three times per day or as directed.  You should exercise both the operative leg and the other leg as well. ° °Exercises include: °  °• Quad Sets - Tighten up the muscle on the front of the thigh (Quad) and hold for 5-10 seconds.   °• Straight Leg Raises - With your knee straight (if you were given a brace, keep it on), lift the leg to 60 degrees, hold for 3 seconds, and slowly lower the leg.  Perform this exercise against  resistance later as your leg gets stronger.  °• Leg Slides: Lying on your back, slowly slide your foot toward your buttocks, bending your knee up off the floor (only go as far as is comfortable). Then slowly slide your foot back down until your leg is flat on the floor again.  °• Angel Wings: Lying on your back spread your legs to the side as far apart as you can without causing discomfort.  °• Hamstring Strength:  Lying on your back, push your heel against the floor with your leg straight by tightening up the muscles of your buttocks.  Repeat, but this time bend your knee to a comfortable angle, and push your heel against the floor.  You may put a pillow under the heel to make it more comfortable if necessary.  ° °A rehabilitation program following joint replacement surgery can speed recovery and prevent re-injury in the future due to weakened muscles. Contact your doctor or a physical therapist for more information on knee rehabilitation.  ° ° °CONSTIPATION ° °Constipation is defined medically as fewer than three stools per week and severe constipation as less than one stool per week.  Even if you have a regular bowel pattern at home, your normal regimen is likely to be disrupted due to multiple reasons following surgery.  Combination of anesthesia, postoperative narcotics, change in appetite and fluid intake all can affect your bowels.  ° °YOU MUST use at least one of the following options; they are listed in order of increasing strength to get the job done.  They are all available over the counter, and you may need to use some, POSSIBLY even all of these options:   ° °Drink plenty of fluids (prune juice may be helpful) and high fiber foods °Colace 100 mg by mouth twice a day  °Senokot for constipation as directed and as needed Dulcolax (bisacodyl), take with full glass of water  °Miralax (polyethylene glycol) once or twice a day as needed. ° °If you have tried all these things and are unable to have a bowel  movement in the first 3-4 days after surgery call either your surgeon or your primary doctor.   ° °If you experience loose stools or diarrhea, hold the medications until you stool forms back up.  If your symptoms do not get better within 1 week or if they get worse, check with your doctor.  If you experience "the worst abdominal pain ever" or develop nausea or vomiting, please contact the office immediately for further recommendations for treatment. ° ° °ITCHING:  If you experience itching with your medications, try taking only a single pain pill, or even half a pain pill at a time.  You can also use Benadryl over the counter for itching or also to help with sleep.  ° °TED HOSE STOCKINGS:  Use stockings on both legs until for at least 2 weeks or as   directed by physician office. They may be removed at night for sleeping. ° °MEDICATIONS:  See your medication summary on the “After Visit Summary” that nursing will review with you.  You may have some home medications which will be placed on hold until you complete the course of blood thinner medication.  It is important for you to complete the blood thinner medication as prescribed. ° °PRECAUTIONS:  If you experience chest pain or shortness of breath - call 911 immediately for transfer to the hospital emergency department.  ° °If you develop a fever greater that 101 F, purulent drainage from wound, increased redness or drainage from wound, foul odor from the wound/dressing, or calf pain - CONTACT YOUR SURGEON.   °                                                °FOLLOW-UP APPOINTMENTS:  If you do not already have a post-op appointment, please call the office for an appointment to be seen by your surgeon.  Guidelines for how soon to be seen are listed in your “After Visit Summary”, but are typically between 1-4 weeks after surgery. ° °OTHER INSTRUCTIONS:  ° °Knee Replacement:  Do not place pillow under knee, focus on keeping the knee straight while resting. CPM  instructions: 0-90 degrees, 2 hours in the morning, 2 hours in the afternoon, and 2 hours in the evening. Place foam block, curve side up under heel at all times except when in CPM or when walking.  DO NOT modify, tear, cut, or change the foam block in any way. ° °MAKE SURE YOU:  °• Understand these instructions.  °• Get help right away if you are not doing well or get worse.  ° ° °Thank you for letting us be a part of your medical care team.  It is a privilege we respect greatly.  We hope these instructions will help you stay on track for a fast and full recovery!  ° ° ° ° ° °Information on my medicine - ELIQUIS® (apixaban) ° °Why was Eliquis® prescribed for you? °Eliquis® was prescribed for you to reduce the risk of blood clots forming after orthopedic surgery.   ° °What do You need to know about Eliquis®? °Take your Eliquis® TWICE DAILY - one tablet in the morning and one tablet in the evening with or without food.  It would be best to take the dose about the same time each day. ° °If you have difficulty swallowing the tablet whole please discuss with your pharmacist how to take the medication safely. ° °Take Eliquis® exactly as prescribed by your doctor and DO NOT stop taking Eliquis® without talking to the doctor who prescribed the medication.  Stopping without other medication to take the place of Eliquis® may increase your risk of developing a clot. ° °After discharge, you should have regular check-up appointments with your healthcare provider that is prescribing your Eliquis®. ° °What do you do if you miss a dose? °If a dose of ELIQUIS® is not taken at the scheduled time, take it as soon as possible on the same day and twice-daily administration should be resumed.  The dose should not be doubled to make up for a missed dose.  Do not take more than one tablet of ELIQUIS at the same time. ° °Important Safety Information °A possible side effect of Eliquis®   is bleeding. You should call your healthcare provider  right away if you experience any of the following: °? Bleeding from an injury or your nose that does not stop. °? Unusual colored urine (red or dark brown) or unusual colored stools (red or black). °? Unusual bruising for unknown reasons. °? A serious fall or if you hit your head (even if there is no bleeding). ° °Some medicines may interact with Eliquis® and might increase your risk of bleeding or clotting while on Eliquis®. To help avoid this, consult your healthcare provider or pharmacist prior to using any new prescription or non-prescription medications, including herbals, vitamins, non-steroidal anti-inflammatory drugs (NSAIDs) and supplements. ° °This website has more information on Eliquis® (apixaban): http://www.eliquis.com/eliquis/home ° ° °

## 2017-11-30 NOTE — Evaluation (Signed)
Physical Therapy Evaluation Patient Details Name: Julie Douglas MRN: [id number] DOB: 1966/03/01 Today's Date: 11/30/2017   History of Present Illness  Pt is a 52 y/o female s/p R TKA. PMH includes HTN and R metatarsal osteotomy.   Clinical Impression  Pt is s/p surgery above with deficits below. Pt with slight R knee buckling so gait limited within the room. Required min to min guard for mobility this session. Will continue to follow acutely to maximize functional mobility independence and safety.     Follow Up Recommendations DC plan and follow up therapy as arranged by surgeon;Supervision for mobility/OOB    Equipment Recommendations  Rolling walker with 5" wheels;3in1 (PT)    Recommendations for Other Services       Precautions / Restrictions Precautions Precautions: Knee Precaution Booklet Issued: Yes (comment) Precaution Comments: Reviewed supine ther ex.  Required Braces or Orthoses: Knee Immobilizer - Right Knee Immobilizer - Right: Other (comment)(until discontinued ) Restrictions Weight Bearing Restrictions: Yes RLE Weight Bearing: Weight bearing as tolerated      Mobility  Bed Mobility Overal bed mobility: Needs Assistance Bed Mobility: Supine to Sit     Supine to sit: Supervision     General bed mobility comments: Supervision for safety. Increased time required to perform.   Transfers Overall transfer level: Needs assistance Equipment used: Rolling walker (2 wheeled) Transfers: Sit to/from Stand Sit to Stand: Min guard         General transfer comment: Min guard for safety. Increased time required to stand. Verbal cues for safe hand placement.   Ambulation/Gait Ambulation/Gait assistance: Min assist;Min guard Ambulation Distance (Feet): 25 Feet Assistive device: Rolling walker (2 wheeled) Gait Pattern/deviations: Step-to pattern;Decreased step length - right;Decreased step length - left;Decreased weight shift to right;Antalgic Gait velocity: Decreased   Gait velocity interpretation: Below normal speed for age/gender General Gait Details: Slow, antalgic gait. Slight R knee buckling even with use of KI so distance limited within the room. Verbal cues for sequencing with RW.   Stairs            Wheelchair Mobility    Modified Rankin (Stroke Patients Only)       Balance Overall balance assessment: Needs assistance Sitting-balance support: No upper extremity supported;Feet supported Sitting balance-Leahy Scale: Good     Standing balance support: Bilateral upper extremity supported;No upper extremity supported;During functional activity Standing balance-Leahy Scale: Fair Standing balance comment: Able to maintain static standing at sink without UE support.                              Pertinent Vitals/Pain Pain Assessment: 0-10 Pain Score: 4  Pain Location: R knee  Pain Descriptors / Indicators: Aching;Operative site guarding Pain Intervention(s): Limited activity within patient's tolerance;Monitored during session;Repositioned    Home Living Family/patient expects to be discharged to:: Private residence Living Arrangements: Children Available Help at Discharge: Family;Available 24 hours/day Type of Home: House Home Access: Stairs to enter Entrance Stairs-Rails: Right Entrance Stairs-Number of Steps: 1 in back 3 in front  Home Layout: One level Home Equipment: None      Prior Function Level of Independence: Independent               Hand Dominance   Dominant Hand: Right    Extremity/Trunk Assessment   Upper Extremity Assessment Upper Extremity Assessment: Defer to OT evaluation    Lower Extremity Assessment Lower Extremity Assessment: RLE deficits/detail RLE Deficits / Details: Sensory in tact.  Deficits consistent with post op pain and weakness. Able to perfrom ther ex below.     Cervical / Trunk Assessment Cervical / Trunk Assessment: Normal  Communication   Communication: No  difficulties  Cognition Arousal/Alertness: Awake/alert Behavior During Therapy: WFL for tasks assessed/performed Overall Cognitive Status: Within Functional Limits for tasks assessed                                        General Comments General comments (skin integrity, edema, etc.): Pt's daughter present during session.     Exercises Total Joint Exercises Ankle Circles/Pumps: AROM;Both;20 reps Quad Sets: AROM;Right;10 reps Towel Squeeze: AROM;Both;10 reps Heel Slides: AROM;Right;10 reps   Assessment/Plan    PT Assessment Patient needs continued PT services  PT Problem List Decreased strength;Decreased range of motion;Decreased balance;Decreased mobility;Decreased coordination;Decreased knowledge of use of DME;Pain       PT Treatment Interventions DME instruction;Gait training;Stair training;Functional mobility training;Therapeutic activities;Therapeutic exercise;Balance training;Neuromuscular re-education;Patient/family education    PT Goals (Current goals can be found in the Care Plan section)  Acute Rehab PT Goals Patient Stated Goal: to go home  PT Goal Formulation: With patient Time For Goal Achievement: 12/14/17 Potential to Achieve Goals: Good    Frequency 7X/week   Barriers to discharge        Co-evaluation               AM-PAC PT "6 Clicks" Daily Activity  Outcome Measure Difficulty turning over in bed (including adjusting bedclothes, sheets and blankets)?: A Little Difficulty moving from lying on back to sitting on the side of the bed? : A Little Difficulty sitting down on and standing up from a chair with arms (e.g., wheelchair, bedside commode, etc,.)?: Unable Help needed moving to and from a bed to chair (including a wheelchair)?: A Little Help needed walking in hospital room?: A Little Help needed climbing 3-5 steps with a railing? : A Lot 6 Click Score: 15    End of Session Equipment Utilized During Treatment: Gait belt;Right  knee immobilizer Activity Tolerance: Patient tolerated treatment well Patient left: in chair;with call bell/phone within reach;with family/visitor present Nurse Communication: Mobility status PT Visit Diagnosis: Other abnormalities of gait and mobility (R26.89);Pain Pain - Right/Left: Right Pain - part of body: Knee    Time: [id number] PT Time Calculation (min) (ACUTE ONLY): 33 min   Charges:   PT Evaluation $PT Eval Low Complexity: 1 Low PT Treatments $Therapeutic Activity: 8-22 mins   PT G Codes:        Gladys DammeBrittany Dvonte Gatliff, PT, DPT  Acute Rehabilitation Services  Pager: [phone]   Lehman PromBrittany S Tejasvi Brissett 11/30/2017, 6:14 PM

## 2017-11-30 NOTE — Interval H&P Note (Signed)
History and Physical Interval Note:  11/30/2017 7:28 AM  Julie Douglas  has presented today for surgery, with the diagnosis of OA RIGHT KNEE  The various methods of treatment have been discussed with the patient and family. After consideration of risks, benefits and other options for treatment, the patient has consented to  Procedure(s): RIGHT TOTAL KNEE ARTHROPLASTY (Right) as a surgical intervention .  The patient's history has been reviewed, patient examined, no change in status, stable for surgery.  I have reviewed the patient's chart and labs.  Questions were answered to the patient's satisfaction.     Thera FlakeW D Christerpher Clos Jr

## 2017-11-30 NOTE — Brief Op Note (Signed)
11/30/2017  9:44 AM  PATIENT:  Julie Douglas  52 y.o. female  PRE-OPERATIVE DIAGNOSIS:  OA RIGHT KNEE  POST-OPERATIVE DIAGNOSIS:  OA RIGHT KNEE  PROCEDURE:  Procedure(s): RIGHT TOTAL KNEE ARTHROPLASTY (Right)  SURGEON:  Surgeon(s) and Role:    Frederico Hamman* Caffrey, Daniel, MD - Primary  PHYSICIAN ASSISTANT: Margart SicklesJoshua Nadra Hritz, PA-C  ASSISTANTS:    ANESTHESIA:   local, spinal and IV sedation  EBL:  minimal  BLOOD ADMINISTERED:none  DRAINS: none   LOCAL MEDICATIONS USED:  MARCAINE     SPECIMEN:  No Specimen  DISPOSITION OF SPECIMEN:  N/A  COUNTS:  YES  TOURNIQUET:   Total Tourniquet Time Documented: Thigh (Right) - 60 minutes Total: Thigh (Right) - 60 minutes   DICTATION: .Other Dictation: Dictation Number unknown  PLAN OF CARE: Admit to inpatient   PATIENT DISPOSITION:  PACU - hemodynamically stable.   Delay start of Pharmacological VTE agent (>24hrs) due to surgical blood loss or risk of bleeding: yes

## 2017-12-01 LAB — CBC
HCT: 34.9 % — ABNORMAL LOW (ref 36.0–46.0)
Hemoglobin: 11.5 g/dL — ABNORMAL LOW (ref 12.0–15.0)
MCH: 28.1 pg (ref 26.0–34.0)
MCHC: 33 g/dL (ref 30.0–36.0)
MCV: 85.3 fL (ref 78.0–100.0)
Platelets: 209 10*3/uL (ref 150–400)
RBC: 4.09 MIL/uL (ref 3.87–5.11)
RDW: 14.2 % (ref 11.5–15.5)
WBC: 10.8 10*3/uL — AB (ref 4.0–10.5)

## 2017-12-01 LAB — BASIC METABOLIC PANEL
ANION GAP: 8 (ref 5–15)
BUN: 11 mg/dL (ref 6–20)
CO2: 25 mmol/L (ref 22–32)
Calcium: 8.5 mg/dL — ABNORMAL LOW (ref 8.9–10.3)
Chloride: 109 mmol/L (ref 101–111)
Creatinine, Ser: 0.66 mg/dL (ref 0.44–1.00)
GFR calc Af Amer: >60 mL/min (ref >60)
GFR calc non Af Amer: >60 mL/min (ref >60)
GLUCOSE: 101 mg/dL — AB (ref 65–99)
POTASSIUM: 3.3 mmol/L — AB (ref 3.5–5.1)
Sodium: 142 mmol/L (ref 135–145)

## 2017-12-01 MED ORDER — INFLUENZA VAC SPLIT QUAD 0.5 ML IM SUSY
0.5000 mL | PREFILLED_SYRINGE | INTRAMUSCULAR | Status: AC
  Administered 2017-12-02: 0.5 mL via INTRAMUSCULAR
  Filled 2017-12-01: qty 0.5

## 2017-12-01 NOTE — Evaluation (Signed)
Occupational Therapy Evaluation Patient Details Name: Julie Douglas MRN: [id number] DOB: 07-07-1966 Today's Date: 12/01/2017    History of Present Illness Pt is a 52 y/o female s/p R TKA. PMH includes HTN and R metatarsal osteotomy.    Clinical Impression   PTA Pt independent in mobility and ADL/IADL. Session limited today due to lethargy suspect of medications. Pt required constant cues for safety during session. Pt mod A for SPT to [facility] , currently max A for LB ADL and Pt will need continued skilled OT in the acute setting and pending progress (per note she did much better with PT today) will need HHOT. Next session to focus on AE education for LB ADL and progression of transfers with RW.     Follow Up Recommendations  Home health OT    Equipment Recommendations  3 in 1 bedside commode;Other (comment)(AE kit)    Recommendations for Other Services       Precautions / Restrictions Precautions Precautions: Knee Precaution Booklet Issued: Yes (comment) Precaution Comments: Reviewed supine ther ex.  Required Braces or Orthoses: Knee Immobilizer - Right Knee Immobilizer - Right: Other (comment) Restrictions Weight Bearing Restrictions: Yes RLE Weight Bearing: Weight bearing as tolerated      Mobility Bed Mobility Overal bed mobility: Needs Assistance Bed Mobility: Supine to Sit;Sit to Supine     Supine to sit: Min assist(to advance RLE to EOB) Sit to supine: Max assist(max A for BLE back into bed)   General bed mobility comments: INcreased time and verbal cues throughout  Transfers Overall transfer level: Needs assistance Equipment used: Rolling walker (2 wheeled) Transfers: Sit to/from [facility] Sit to Stand: Min assist Stand pivot transfers: Mod assist       General transfer comment: veberbal cues for safety and sequencing    Balance Overall balance assessment: Needs assistance Sitting-balance support: No upper extremity supported;Feet  supported Sitting balance-Leahy Scale: Good Sitting balance - Comments: cues for alertness   Standing balance support: Bilateral upper extremity supported;No upper extremity supported;During functional activity Standing balance-Leahy Scale: Poor Standing balance comment: suspect due to medications                           ADL either performed or assessed with clinical judgement   ADL Overall ADL's : Needs assistance/impaired Eating/Feeding: Modified independent   Grooming: Set up;Wash/dry hands;Sitting Grooming Details (indicate cue type and reason): sitting EOB Upper Body Bathing: Moderate assistance   Lower Body Bathing: Maximal assistance   Upper Body Dressing : Minimal assistance   Lower Body Dressing: Maximal assistance   Toilet Transfer: Moderate assistance;Stand-pivot;BSC;RW;Cueing for safety;Cueing for sequencing Toilet Transfer Details (indicate cue type and reason): multiple cues for alertness and safety during transfer [place] and Hygiene: Moderate assistance;Sit to/from stand Toileting - Clothing Manipulation Details (indicate cue type and reason): Pt able to perform peri care, assist needed to manage underwear     Functional mobility during ADLs: (SPT only this session) General ADL Comments: Pt's ability to participate limited by lethargy suspect of medications     Vision Baseline Vision/History: Wears glasses Wears Glasses: At all times Patient Visual Report: No change from baseline       Perception     Praxis      Pertinent Vitals/Pain Pain Assessment: 0-10 Pain Score: 10-Worst pain ever Pain Location: R knee  Pain Descriptors / Indicators: Aching;Operative site guarding;Burning Pain Intervention(s): Monitored during session;Repositioned;RN gave pain meds during session  Hand Dominance Right   Extremity/Trunk Assessment Upper Extremity Assessment Upper Extremity Assessment: Overall WFL for tasks assessed    Lower Extremity Assessment Lower Extremity Assessment: RLE deficits/detail RLE Deficits / Details: s/p sx   Cervical / Trunk Assessment Cervical / Trunk Assessment: Normal   Communication Communication Communication: No difficulties   Cognition Arousal/Alertness: Suspect due to medications Behavior During Therapy: Flat affect Overall Cognitive Status: Difficult to assess                                 General Comments: Pt was very very sleepy during session - suspect due to medications. She took almost constant prompting to keep awake for transfer. Son present during session   General Comments  Pt not appropriate for AE education currently due to lethargy    Exercises     Shoulder Instructions      Home Living Family/patient expects to be discharged to:: Private residence Living Arrangements: Children Available Help at Discharge: Family;Available 24 hours/day Type of Home: House Home Access: Stairs to enter [employer] of Steps: 1 in back 3 in front  Entrance Stairs-Rails: Right Home Layout: One level     Bathroom Shower/Tub: Teacher, early years/pre: Standard     Home Equipment: None          Prior Functioning/Environment Level of Independence: Independent                 OT Problem List: Decreased range of motion;Decreased activity tolerance;Impaired balance (sitting and/or standing);Decreased safety awareness;Decreased knowledge of use of DME or AE;Obesity;Pain      OT Treatment/Interventions: Self-care/ADL training;DME and/or AE instruction;Manual therapy;Therapeutic activities;Patient/family education;Balance training    OT Goals(Current goals can be found in the care plan section) Acute Rehab OT Goals Patient Stated Goal: Pt did not participate in goal setting ADL Goals Pt Will Perform Lower Body Bathing: with modified independence;with adaptive equipment;sitting/lateral leans Pt Will Perform Lower Body  Dressing: with modified independence;with adaptive equipment;sit to/from stand Pt Will Transfer to Toilet: with supervision;ambulating Pt Will Perform Toileting - Clothing Manipulation and hygiene: with modified independence;sit to/from stand Pt Will Perform Tub/Shower Transfer: Tub transfer;with supervision;3 in 1;rolling walker  OT Frequency: Min 2X/week   Barriers to D/C:            Co-evaluation              AM-PAC PT "6 Clicks" Daily Activity     Outcome Measure Help from another person eating meals?: None Help from another person taking care of personal grooming?: A Little Help from another person toileting, which includes using toliet, bedpan, or urinal?: A Lot Help from another person bathing (including washing, rinsing, drying)?: A Lot Help from another person to put on and taking off regular upper body clothing?: A Little Help from another person to put on and taking off regular lower body clothing?: A Lot 6 Click Score: 16   End of Session Equipment Utilized During Treatment: Gait belt;Rolling walker CPM Right Knee CPM Right Knee: Off Right Knee Flexion (Degrees): 90 Right Knee Extension (Degrees): 0 Nurse Communication: Mobility status;Other (comment)(arousal level)  Activity Tolerance: Patient limited by lethargy Patient left: in bed;with call bell/phone within reach;with bed alarm set;with family/visitor present;with SCD's reapplied  OT Visit Diagnosis: Unsteadiness on feet (R26.81);Other abnormalities of gait and mobility (R26.89);Pain Pain - Right/Left: Right Pain - part of body: Knee  Time: 8022-3361 OT Time Calculation (min): 39 min Charges:  OT General Charges $OT Visit: 1 Visit OT Evaluation $OT Eval Moderate Complexity: 1 Mod OT Treatments $Self Care/Home Management : 23-37 mins G-Codes:     Hulda Humphrey OTR/L [place] 12/01/2017, 5:47 PM

## 2017-12-01 NOTE — Progress Notes (Signed)
    Subjective: Patient reports pain as moderate, controlled.  Tolerating diet.  Urinating.  +Flatus.  No CP, SOB.    Objective:   VITALS:   Vitals:   11/30/17 1728 11/30/17 1947 12/01/17 0527 12/01/17 0813  BP: (!) 119/55 (!) 107/47 133/66 137/72  Pulse: 68 (!) 56 80 85  Resp:    18  Temp: 98.2 F (36.8 C) 98.1 F (36.7 C) 98.1 F (36.7 C) 98.1 F (36.7 C)  TempSrc: Oral Oral Oral Oral  SpO2: 100% 99% 100% 98%  Weight:      Height:       CBC Latest Ref Rng & Units 12/01/2017 11/19/2017 12/29/2015  WBC 4.0 - 10.5 K/uL 10.8(H) 8.3 5.9  Hemoglobin 12.0 - 15.0 g/dL 11.5(L) 12.8 12.5  Hematocrit 36.0 - 46.0 % 34.9(L) 38.7 37.8  Platelets 150 - 400 K/uL 209 225 230   BMP Latest Ref Rng & Units 12/01/2017 11/19/2017 08/27/2017  Glucose 65 - 99 mg/dL 409(W101(H) 83 89  BUN 6 - 20 mg/dL 11 12 14   Creatinine 0.44 - 1.00 mg/dL 1.190.66 1.470.71 8.290.76  Sodium 135 - 145 mmol/L 142 140 143  Potassium 3.5 - 5.1 mmol/L 3.3(L) 3.5 3.4(L)  Chloride 101 - 111 mmol/L 109 105 110  CO2 22 - 32 mmol/L 25 25 26   Calcium 8.9 - 10.3 mg/dL 5.6(O8.5(L) 9.2 1.3(Y8.7(L)   Intake/Output      02/01 0701 - 02/02 0700 02/02 0701 - 02/03 0700   P.O. 730 120   I.V. (mL/kg) 2267.5 (22.9)    Total Intake(mL/kg) 2997.5 (30.3) 120 (1.2)   Urine (mL/kg/hr) 925 (0.4)    Blood 20    Total Output 945    Net +2052.5 +120        Urine Occurrence 1 x 1 x      Physical Exam: General: NAD.  Supine in bed.  Calm, conversant.  No increased work of breathing. MSK  RLE: Neurovascularly intact Sensation intact distally Feet warm Dorsiflexion/Plantar flexion intact Incision: dressing C/D/I   Assessment: 1 Day Post-Op  S/P Procedure(s) (LRB): RIGHT TOTAL KNEE ARTHROPLASTY (Right) by Dr. Madelon Lipsaffrey on 11/30/17  Active Problems:   Primary localized osteoarthritis of right knee  ADDITIONAL DIAGNOSIS:  Acute Blood Loss Anemia.  Mild, asymptomatic.  Hemoglobin 11.5.  Platelets WNL.   Right knee osteoarthritis status post R TKA Doing  well postop day 1. Eating, drinking, and voiding.  Early mobilization to bathroom and with PT in room.  Plan: Advance diet Up with therapy D/C IV fluids Plan for discharge tomorrow Incentive Spirometry Elevate and Apply ice CPM, bone foam.  Weight Bearing: Weight Bearing as Tolerated (WBAT) RLE Dressings: Reinforce PRN.  Will change tomorrow-Sunday.  VTE prophylaxis: Eliquis, SCDs, ambulation Dispo: Home tomorrow in care of her son.  Continue pain control, mobilization with therapy.   Albina BilletHenry Calvin Martensen III, PA-C 12/01/2017, 8:44 AM

## 2017-12-01 NOTE — Progress Notes (Signed)
Physical Therapy Treatment Patient Details Name: Julie Douglas MRN: [id number] DOB: 1966/03/23 Today's Date: 12/01/2017    History of Present Illness Pt is a [age] y/o female s/p R TKA. PMH includes HTN and R metatarsal osteotomy.     PT Comments    PT requested to assist patient to bathroom, and discuss progress with daughter who arrived since AM session. Discussed level of support required at this time with family and made plan to work again this evening to increasing independence with functional movements. Patient close supervision level with all mobility at this time.    Follow Up Recommendations  DC plan and follow up therapy as arranged by surgeon;Supervision for mobility/OOB     Equipment Recommendations  Rolling walker with 5" wheels;3in1 (PT)    Recommendations for Other Services       Precautions / Restrictions Precautions Precautions: Knee Precaution Booklet Issued: Yes (comment) Precaution Comments: Reviewed supine ther ex.  Required Braces or Orthoses: Knee Immobilizer - Right Knee Immobilizer - Right: Other (comment) Restrictions Weight Bearing Restrictions: Yes RLE Weight Bearing: Weight bearing as tolerated    Mobility  Bed Mobility Overal bed mobility: Needs Assistance Bed Mobility: Supine to Sit     Supine to sit: Supervision     General bed mobility comments: Supervision for safety. Increased time required to perform.   Transfers Overall transfer level: Needs assistance Equipment used: Rolling walker (2 wheeled) Transfers: Sit to/from Stand Sit to Stand: Min guard         General transfer comment: Min guard for safety. Increased time required to stand. pt sit to stand x4 to toliet and back to bedside chair with supervision.   Ambulation/Gait Ambulation/Gait assistance: Supervision;Min guard Ambulation Distance (Feet): 30 Feet Assistive device: Rolling walker (2 wheeled) Gait Pattern/deviations: Step-to pattern;Decreased step length -  right;Decreased step length - left;Decreased weight shift to right;Antalgic Gait velocity: Decreased    General Gait Details: slow antalgic gait, no knee buckling at this time.    Stairs            Wheelchair Mobility    Modified Rankin (Stroke Patients Only)       Balance Overall balance assessment: Needs assistance Sitting-balance support: No upper extremity supported;Feet supported Sitting balance-Leahy Scale: Good     Standing balance support: Bilateral upper extremity supported;No upper extremity supported;During functional activity Standing balance-Leahy Scale: Fair Standing balance comment: Able to maintain static standing at sink without UE support.                             Cognition Arousal/Alertness: Awake/alert Behavior During Therapy: WFL for tasks assessed/performed Overall Cognitive Status: Within Functional Limits for tasks assessed                                        Exercises      General Comments General comments (skin integrity, edema, etc.): Extensive dicussion with family and daughter over safety considerations for home and best guarding/handling techinques with transfers.       Pertinent Vitals/Pain Pain Assessment: Faces Faces Pain Scale: Hurts whole lot Pain Location: R knee  Pain Descriptors / Indicators: Aching;Operative site guarding Pain Intervention(s): Limited activity within patient's tolerance;Monitored during session;Premedicated before session    Home Living  Prior Function            PT Goals (current goals can now be found in the care plan section) Acute Rehab PT Goals Patient Stated Goal: to go home  PT Goal Formulation: With patient Time For Goal Achievement: 12/14/17 Potential to Achieve Goals: Good Progress towards PT goals: Progressing toward goals    Frequency    7X/week      PT Plan Current plan remains appropriate    Co-evaluation               AM-PAC PT "6 Clicks" Daily Activity  Outcome Measure  Difficulty turning over in bed (including adjusting bedclothes, sheets and blankets)?: A Little Difficulty moving from lying on back to sitting on the side of the bed? : A Little Difficulty sitting down on and standing up from a chair with arms (e.g., wheelchair, bedside commode, etc,.)?: A Little Help needed moving to and from a bed to chair (including a wheelchair)?: A Little Help needed walking in hospital room?: A Little Help needed climbing 3-5 steps with a railing? : A Lot 6 Click Score: 17    End of Session Equipment Utilized During Treatment: Gait belt;Right knee immobilizer Activity Tolerance: Patient tolerated treatment well Patient left: in chair;with call bell/phone within reach;with family/visitor present Nurse Communication: Mobility status PT Visit Diagnosis: Other abnormalities of gait and mobility (R26.89);Pain Pain - Right/Left: Right Pain - part of body: Knee     Time: 1610-9604 PT Time Calculation (min) (ACUTE ONLY): 20 min  Charges:  $Gait Training: 8-22 mins $Therapeutic Activity: 8-22 mins                    G Codes:      Etta Grandchild, PT, DPT Acute Rehab Services Pager: [phone]    Etta Grandchild 12/01/2017, 1:59 PM

## 2017-12-01 NOTE — Care Management Note (Signed)
Case Management Note  Patient Details  Name: Myrtice LauthMarie Mewborn MRN: [id number] Date of Birth: [birth year]  Subjective/Objective: 52 yr old female s/p right total knee arthroplasty.                   Action/Plan: Case manager spoke with patient concerning discharge plan. She was preoperatively setup with [facility], no changes. Patient says she will have family support at discharge.    Expected Discharge Date:   12/02/17               Expected Discharge Plan:  Home w Home Health Services  In-House Referral:  NA  Discharge planning Services  CM Consult  Post Acute Care Choice:  Durable Medical Equipment, Home Health Choice offered to:  Patient  DME Arranged:  3-N-1, Walker rolling, CPM DME Agency:  [facility] [employer]  HH Arranged:  PT HH Agency:  [facility]  Status of Service:  Completed, signed off  If discussed at [employer] Meetings, dates discussed:    Additional Comments:  Durenda GuthrieBrady, Hue Steveson Naomi, RN 12/01/2017, 8:51 AM

## 2017-12-01 NOTE — Progress Notes (Signed)
Physical Therapy Treatment Patient Details Name: Julie LauthMarie Douglas MRN: [id number] DOB: [birth year] Today's Date: 12/01/2017    History of Present Illness Pt is a 52 y/o female s/p R TKA. PMH includes HTN and R metatarsal osteotomy.     PT Comments    Patient progressing with therapy. Limited session due to pain as patient as ambulated 100 feet with nursing earlier this morning. Pt now close supervision level with ambulation with poor mechanics. Pt with independent bed mobility. Agreeable to increase activity this PM session.      Follow Up Recommendations  DC plan and follow up therapy as arranged by surgeon;Supervision for mobility/OOB     Equipment Recommendations  Rolling walker with 5" wheels;3in1 (PT)    Recommendations for Other Services       Precautions / Restrictions Precautions Precautions: Knee Precaution Booklet Issued: Yes (comment) Precaution Comments: Reviewed supine ther ex.  Required Braces or Orthoses: Knee Immobilizer - Right Knee Immobilizer - Right: Other (comment) Restrictions Weight Bearing Restrictions: Yes RLE Weight Bearing: Weight bearing as tolerated    Mobility  Bed Mobility Overal bed mobility: Needs Assistance Bed Mobility: Supine to Sit     Supine to sit: Supervision     General bed mobility comments: Supervision for safety. Increased time required to perform.   Transfers Overall transfer level: Needs assistance Equipment used: Rolling walker (2 wheeled) Transfers: Sit to/from Stand Sit to Stand: Min guard         General transfer comment: Min guard for safety. Increased time required to stand. Verbal cues for safe hand placement.   Ambulation/Gait Ambulation/Gait assistance: Supervision;Min guard Ambulation Distance (Feet): 50 Feet Assistive device: Rolling walker (2 wheeled) Gait Pattern/deviations: Step-to pattern;Decreased step length - right;Decreased step length - left;Decreased weight shift to right;Antalgic Gait velocity:  Decreased    General Gait Details: slow antalgic gait, no knee buckling at this time.    Stairs            Wheelchair Mobility    Modified Rankin (Stroke Patients Only)       Balance Overall balance assessment: Needs assistance Sitting-balance support: No upper extremity supported;Feet supported Sitting balance-Leahy Scale: Good     Standing balance support: Bilateral upper extremity supported;No upper extremity supported;During functional activity Standing balance-Leahy Scale: Fair                              Cognition Arousal/Alertness: Awake/alert Behavior During Therapy: WFL for tasks assessed/performed Overall Cognitive Status: Within Functional Limits for tasks assessed                                        Exercises      General Comments        Pertinent Vitals/Pain Pain Assessment: Faces Faces Pain Scale: Hurts whole lot Pain Location: R knee  Pain Descriptors / Indicators: Aching;Operative site guarding Pain Intervention(s): Limited activity within patient's tolerance;Monitored during session;Premedicated before session    Home Living                      Prior Function            PT Goals (current goals can now be found in the care plan section) Acute Rehab PT Goals Patient Stated Goal: to go home  PT Goal Formulation: With patient Time For Goal Achievement: 12/14/17  Potential to Achieve Goals: Good Progress towards PT goals: Progressing toward goals    Frequency    7X/week      PT Plan Current plan remains appropriate    Co-evaluation              AM-PAC PT "6 Clicks" Daily Activity  Outcome Measure  Difficulty turning over in bed (including adjusting bedclothes, sheets and blankets)?: A Little Difficulty moving from lying on back to sitting on the side of the bed? : A Little Difficulty sitting down on and standing up from a chair with arms (e.g., wheelchair, bedside commode,  etc,.)?: A Little Help needed moving to and from a bed to chair (including a wheelchair)?: A Little Help needed walking in hospital room?: A Little Help needed climbing 3-5 steps with a railing? : A Lot 6 Click Score: 17    End of Session Equipment Utilized During Treatment: Gait belt;Right knee immobilizer Activity Tolerance: Patient tolerated treatment well Patient left: in chair;with call bell/phone within reach;with family/visitor present Nurse Communication: Mobility status PT Visit Diagnosis: Other abnormalities of gait and mobility (R26.89);Pain Pain - Right/Left: Right Pain - part of body: Knee     Time: 1235-1250 PT Time Calculation (min) (ACUTE ONLY): 15 min  Charges:  $Gait Training: 8-22 mins                    G Codes:       Etta Grandchild, PT, DPT Acute Rehab Services Pager: [phone]    Etta Grandchild 12/01/2017, 12:56 PM

## 2017-12-01 NOTE — Progress Notes (Addendum)
Physical Therapy Treatment Patient Details Name: Julie LauthMarie Ouzts MRN: [id number] DOB: 1965-11-30 Today's Date: 12/01/2017    History of Present Illness Pt is a 52 y/o female s/p R TKA. PMH includes HTN and R metatarsal osteotomy.     PT Comments    Visited patient for PM session, very lethargic due to medication. Daughter very involved and expresses concerns for her mothers lack of supervision/quality caregiver support  at home once medically cleared for d/c. Educated on ways to reduce risk of injury and discussed HHPT role and frequency, as well as promised that PT would re-assess her status and update d/c recommendations tomorrow if appropriate.  Pt limited this visit, but able to ambulate to bathroom with supervision and no physical assistance. Educated on stairs with daughter and patient but did not attempt due to safety/drowsiness as patient was in and out of sleep throughout visit.     Follow Up Recommendations  DC plan and follow up therapy as arranged by surgeon;Supervision for mobility/OOB     Equipment Recommendations  Rolling walker with 5" wheels;3in1 (PT) Tub Bench.    Recommendations for Other Services       Precautions / Restrictions Precautions Precautions: Knee Precaution Booklet Issued: Yes (comment) Precaution Comments: Reviewed supine ther ex.  Required Braces or Orthoses: Knee Immobilizer - Right Knee Immobilizer - Right: Other (comment) Restrictions Weight Bearing Restrictions: Yes RLE Weight Bearing: Weight bearing as tolerated    Mobility  Bed Mobility Overal bed mobility: Needs Assistance Bed Mobility: Supine to Sit;Sit to Supine     Supine to sit: Supervision Sit to supine: Supervision   General bed mobility comments: inncreased time and effort, able to perform without physcial assitance  Transfers Overall transfer level: Needs assistance Equipment used: Rolling walker (2 wheeled) Transfers: Sit to/from UGI CorporationStand;Stand Pivot Transfers Sit to Stand:  Supervision Stand pivot transfers: Supervision       General transfer comment: supervision with increased time and effort, good hand placement   Ambulation/Gait                 Stairs Stairs: Yes       General stair comments: provided education to family and patient on techinque in stair gym, patient too drowsy to safely attempt this visit.  Wheelchair Mobility    Modified Rankin (Stroke Patients Only)       Balance Overall balance assessment: Needs assistance Sitting-balance support: No upper extremity supported;Feet supported Sitting balance-Leahy Scale: Good Sitting balance - Comments: cues for alertness   Standing balance support: Bilateral upper extremity supported;No upper extremity supported;During functional activity Standing balance-Leahy Scale: Poor Standing balance comment: suspect due to medications                            Cognition Arousal/Alertness: Suspect due to medications Behavior During Therapy: Flat affect Overall Cognitive Status: Difficult to assess                                 General Comments: Pt was very very sleepy during session - suspect due to medications. She took almost constant prompting to keep awake for transfer. Son present during session      Exercises      General Comments General comments (skin integrity, edema, etc.): extensive discussion with daughter about home set up and safety, level of caregiver support.       Pertinent Vitals/Pain Pain Assessment: Faces  Pain Score: 10-Worst pain ever Faces Pain Scale: Hurts whole lot Pain Location: R knee  Pain Descriptors / Indicators: Aching;Operative site guarding;Burning Pain Intervention(s): Monitored during session;Repositioned;RN gave pain meds during session    Home Living Family/patient expects to be discharged to:: Private residence Living Arrangements: Children Available Help at Discharge: Family;Available 24 hours/day Type of  Home: House Home Access: Stairs to enter Entrance Stairs-Rails: Right Home Layout: One level Home Equipment: None      Prior Function Level of Independence: Independent          PT Goals (current goals can now be found in the care plan section) Acute Rehab PT Goals Patient Stated Goal: Pt did not participate in goal setting PT Goal Formulation: With patient Time For Goal Achievement: 12/14/17 Potential to Achieve Goals: Good Progress towards PT goals: Progressing toward goals    Frequency    7X/week      PT Plan Current plan remains appropriate    Co-evaluation              AM-PAC PT "6 Clicks" Daily Activity  Outcome Measure  Difficulty turning over in bed (including adjusting bedclothes, sheets and blankets)?: A Little Difficulty moving from lying on back to sitting on the side of the bed? : A Little Difficulty sitting down on and standing up from a chair with arms (e.g., wheelchair, bedside commode, etc,.)?: A Little Help needed moving to and from a bed to chair (including a wheelchair)?: A Little Help needed walking in hospital room?: A Little Help needed climbing 3-5 steps with a railing? : A Lot 6 Click Score: 17    End of Session Equipment Utilized During Treatment: Gait belt;Right knee immobilizer Activity Tolerance: Patient limited by lethargy Patient left: with call bell/phone within reach;with family/visitor present;in chair Nurse Communication: Mobility status PT Visit Diagnosis: Other abnormalities of gait and mobility (R26.89);Pain Pain - Right/Left: Right Pain - part of body: Knee     Time: 1800-1835 PT Time Calculation (min) (ACUTE ONLY): 35 min  Charges:  $Therapeutic Activity: 8-22 mins $Self Care/Home Management: 8-22                    G Codes:      Etta Grandchild, PT, DPT Acute Rehab Services Pager: [phone]     Etta Grandchild 12/01/2017, 6:59 PM

## 2017-12-02 LAB — CBC
HCT: 31.9 % — ABNORMAL LOW (ref 36.0–46.0)
Hemoglobin: 10.6 g/dL — ABNORMAL LOW (ref 12.0–15.0)
MCH: 28.2 pg (ref 26.0–34.0)
MCHC: 33.2 g/dL (ref 30.0–36.0)
MCV: 84.8 fL (ref 78.0–100.0)
PLATELETS: 201 10*3/uL (ref 150–400)
RBC: 3.76 MIL/uL — AB (ref 3.87–5.11)
RDW: 14.2 % (ref 11.5–15.5)
WBC: 13.7 10*3/uL — AB (ref 4.0–10.5)

## 2017-12-02 MED ORDER — HYDROMORPHONE HCL 1 MG/ML IJ SOLN: 0.5000 mg | INTRAMUSCULAR | Status: DC | PRN

## 2017-12-02 NOTE — Progress Notes (Signed)
Physical Therapy Treatment Patient Details Name: Julie Douglas MRN: [id number] DOB: 1966/03/11 Today's Date: 12/02/2017    History of Present Illness Pt is a 52 y/o female s/p R TKA. PMH includes HTN and R metatarsal osteotomy.     PT Comments    Patient is progressing toward mobility goals. Pt tolerated increased gait distance and stair training well. Daughter present. Pt overall supervision/min guard for OOB mobility. Current plan remains appropriate.    Follow Up Recommendations  DC plan and follow up therapy as arranged by surgeon;Supervision for mobility/OOB     Equipment Recommendations  Rolling walker with 5" wheels;3in1 (PT)    Recommendations for Other Services       Precautions / Restrictions Precautions Precautions: Knee Precaution Booklet Issued: Yes (comment) Precaution Comments: precautions/posoitioning reviewed with pt and pt's daughter Required Braces or Orthoses: Knee Immobilizer - Right Restrictions Weight Bearing Restrictions: Yes RLE Weight Bearing: Weight bearing as tolerated    Mobility  Bed Mobility Overal bed mobility: Needs Assistance Bed Mobility: Supine to Sit;Sit to Supine     Supine to sit: Supervision Sit to supine: Supervision;Min guard   General bed mobility comments: cues for seqeuncing and technique; HOB flat  Transfers Overall transfer level: Needs assistance Equipment used: Rolling walker (2 wheeled) Transfers: Sit to/from Stand Sit to Stand: Supervision         General transfer comment: supervision for safety; cues for safe hand placement with carry over demonstrated from previous session  Ambulation/Gait Ambulation/Gait assistance: Supervision;Min guard Ambulation Distance (Feet): 200 Feet Assistive device: Rolling walker (2 wheeled) Gait Pattern/deviations: Step-through pattern;Decreased stance time - right;Decreased step length - left;Decreased stride length;Antalgic Gait velocity: Decreased    General Gait Details:  cues for sequencing, posture, and proximity to RW   Stairs Stairs: Yes   Stair Management: One rail Right;Step to pattern;Sideways Number of Stairs: 4 General stair comments: cues for sequencing and technique; min guard for safety and KI donned; daughter present  Wheelchair Mobility    Modified Rankin (Stroke Patients Only)       Balance Overall balance assessment: Needs assistance Sitting-balance support: No upper extremity supported;Feet supported Sitting balance-Leahy Scale: Good     Standing balance support: Bilateral upper extremity supported;No upper extremity supported;During functional activity Standing balance-Leahy Scale: Poor                              Cognition Arousal/Alertness: Awake/alert Behavior During Therapy: WFL for tasks assessed/performed Overall Cognitive Status: Within Functional Limits for tasks assessed                                        Exercises      General Comments        Pertinent Vitals/Pain Pain Assessment: Faces Faces Pain Scale: Hurts little more Pain Location: R knee  Pain Descriptors / Indicators: Guarding;Grimacing;Sore Pain Intervention(s): Limited activity within patient's tolerance;Monitored during session;Premedicated before session;Repositioned    Home Living                      Prior Function            PT Goals (current goals can now be found in the care plan section) Acute Rehab PT Goals PT Goal Formulation: With patient Time For Goal Achievement: 12/14/17 Potential to Achieve Goals: Good Progress towards PT goals: Progressing  toward goals    Frequency    7X/week      PT Plan Current plan remains appropriate    Co-evaluation              AM-PAC PT "6 Clicks" Daily Activity  Outcome Measure  Difficulty turning over in bed (including adjusting bedclothes, sheets and blankets)?: A Little Difficulty moving from lying on back to sitting on the side of  the bed? : A Little Difficulty sitting down on and standing up from a chair with arms (e.g., wheelchair, bedside commode, etc,.)?: A Little Help needed moving to and from a bed to chair (including a wheelchair)?: A Little Help needed walking in hospital room?: A Little Help needed climbing 3-5 steps with a railing? : A Little 6 Click Score: 18    End of Session Equipment Utilized During Treatment: Gait belt;Right knee immobilizer Activity Tolerance: Patient tolerated treatment well Patient left: with call bell/phone within reach;with family/visitor present;in bed;Other (comment)(OT present end of session) Nurse Communication: Mobility status PT Visit Diagnosis: Other abnormalities of gait and mobility (R26.89);Pain Pain - Right/Left: Right Pain - part of body: Knee     Time: 1610-9604 PT Time Calculation (min) (ACUTE ONLY): 23 min  Charges:  $Gait Training: 8-22 mins $Therapeutic Activity: 8-22 mins                    G Codes:       Erline Levine, PTA Pager: [phone]     Carolynne Edouard 12/02/2017, 11:05 AM

## 2017-12-02 NOTE — Care Management (Signed)
Spoke with pt.  RW and 3in1 delivered yesterday.  [facility] following.  Pt verbalizes understanding of plan.  No further CM needs at this time.

## 2017-12-02 NOTE — Progress Notes (Signed)
Occupational Therapy Treatment Patient Details Name: Julie Douglas MRN: [id number] DOB: [birth year] Today's Date: 12/02/2017    History of present illness Pt is a 52 y/o female s/p R TKA. PMH includes HTN and R metatarsal osteotomy.    OT comments  Pt progressing towards OT goals this session. Focus was BSC transfer, education on 3 in 1 as DME, and AE kit education/practice - with teach back. Pt very sleepy during session (suspect medication) Julie Douglas present during session and participant in all education and effective caretaker for mother/Pt. OT education for acute setting complete as Pt is expected to discharge today. Pt still requires HHOT to maximize safety and independence in ADL and functional transfers.   Follow Up Recommendations  Home health OT    Equipment Recommendations  3 in 1 bedside commode(AE kit provided by OT)    Recommendations for Other Services      Precautions / Restrictions Precautions Precautions: Knee Precaution Booklet Issued: Yes (comment) Precaution Comments: previously supplied by PT/PTA Required Braces or Orthoses: Knee Immobilizer - Right Restrictions Weight Bearing Restrictions: Yes RLE Weight Bearing: Weight bearing as tolerated       Mobility Bed Mobility Overal bed mobility: Needs Assistance Bed Mobility: Supine to Sit;Sit to Supine     Supine to sit: Supervision Sit to supine: Supervision;Min guard   General bed mobility comments: cues for seqeuncing and technique; HOB flat  Transfers Overall transfer level: Needs assistance Equipment used: Rolling walker (2 wheeled) Transfers: Sit to/from [employer] Sit to Stand: Supervision Stand pivot transfers: Min guard       General transfer comment: supervision for safety; cues for safe hand placement with carry over demonstrated from previous session    Balance Overall balance assessment: Needs assistance Sitting-balance support: No upper extremity supported;Feet  supported Sitting balance-Leahy Scale: Good     Standing balance support: Bilateral upper extremity supported;No upper extremity supported;During functional activity Standing balance-Leahy Scale: Poor Standing balance comment: reliant on RW                           ADL either performed or assessed with clinical judgement   ADL Overall ADL's : Needs assistance/impaired             Lower Body Bathing: Min guard;With adaptive equipment;Sitting/lateral leans Lower Body Bathing Details (indicate cue type and reason): educated on long handle sponge     Lower Body Dressing: Minimal assistance;With adaptive equipment;Sit to/from stand Lower Body Dressing Details (indicate cue type and reason): educated in long handle shoe horn, reacher/grabber, and sock donner - teach back method Toilet Transfer: Min Geophysical data processor Details (indicate cue type and reason): multiple cues for alertness and safety during transfer [place] and Hygiene: Min guard;Sit to/from stand Toileting - Clothing Manipulation Details (indicate cue type and reason): Pt min guard for safety   Tub/Shower Transfer Details (indicate cue type and reason): Educated Pt and Douglas in use of 3 in 1 as BSC, toilet rised, and shower chair   General ADL Comments: Pt still falling asleep during session and education - Douglas present and educated in full     Vision       Perception     Praxis      Cognition Arousal/Alertness: Awake/alert Behavior During Therapy: WFL for tasks assessed/performed Overall Cognitive Status: Within Functional Limits for tasks assessed  Exercises     Shoulder Instructions       General Comments Pt's Douglas present and participant in education    Pertinent Vitals/ Pain       Pain Assessment: 0-10 Pain Score: 8  Pain Location: R knee  Pain Descriptors / Indicators:  Guarding;Grimacing;Sore Pain Intervention(s): Limited activity within patient's tolerance;Monitored during session;Repositioned  Home Living                                          Prior Functioning/Environment              Frequency  Min 2X/week        Progress Toward Goals  OT Goals(current goals can now be found in the care plan section)  Progress towards OT goals: Progressing toward goals  Acute Rehab OT Goals Patient Stated Goal: to get home  Plan Discharge plan remains appropriate;Frequency remains appropriate    Co-evaluation                 AM-PAC PT "6 Clicks" Daily Activity     Outcome Measure   Help from another person eating meals?: None Help from another person taking care of personal grooming?: A Little Help from another person toileting, which includes using toliet, bedpan, or urinal?: A Little Help from another person bathing (including washing, rinsing, drying)?: A Little Help from another person to put on and taking off regular upper body clothing?: A Little Help from another person to put on and taking off regular lower body clothing?: A Little 6 Click Score: 19    End of Session Equipment Utilized During Treatment: Gait belt;Rolling walker  OT Visit Diagnosis: Unsteadiness on feet (R26.81);Other abnormalities of gait and mobility (R26.89);Pain Pain - Right/Left: Right Pain - part of body: Knee   Activity Tolerance Patient limited by lethargy   Patient Left in bed;with call bell/phone within reach;with bed alarm set;with family/visitor present;with SCD's reapplied   Nurse Communication Mobility status        Time: 4715-9539 OT Time Calculation (min): 20 min  Charges: OT General Charges $OT Visit: 1 Visit OT Treatments $Self Care/Home Management : 8-22 mins  Hulda Humphrey OTR/L [place] 12/02/2017, 4:05 PM

## 2017-12-02 NOTE — Discharge Summary (Signed)
Discharge Summary  Patient ID: Julie Douglas MRN: [id number] DOB/AGE: [birth year] 52 y.o.  Admit date: 11/30/2017 Discharge date: 12/02/2017  Admission Diagnoses:  Primary localized osteoarthritis of right knee  Discharge Diagnoses:  Principal Problem:   Primary localized osteoarthritis of right knee   Past Medical History:  Diagnosis Date  . Arthritis   . GERD (gastroesophageal reflux disease)   . History of kidney stones   . Hypertension     Surgeries: Procedure(s): RIGHT TOTAL KNEE ARTHROPLASTY on 11/30/2017   Consultants (if any):   Discharged Condition: Improved  Hospital Course: Julie LauthMarie Mcnair is an 52 y.o. female who was admitted 11/30/2017 with a diagnosis of Primary localized osteoarthritis of right knee and went to the operating room on 11/30/2017 and underwent the above named procedures.    She was given perioperative antibiotics:  Anti-infectives (From admission, onward)   Start     Dose/Rate Route Frequency Ordered Stop   11/30/17 1530  ceFAZolin (ANCEF) IVPB 1 g/50 mL premix     1 g 100 mL/hr over 30 Minutes Intravenous Every 6 hours 11/30/17 1414 12/01/17 0027   11/30/17 0543  ceFAZolin (ANCEF) IVPB 2g/100 mL premix     2 g 200 mL/hr over 30 Minutes Intravenous On call to O.R. 11/30/17 0543 11/30/17 0745    .  She was given sequential compression devices, early ambulation, and Eliquis for DVT prophylaxis.  She benefited maximally from the hospital stay, mobilized with therapies and there were no complications.   Recent vital signs:  Vitals:   12/02/17 0343 12/02/17 0528  BP: (!) 117/57   Pulse: (!) 110   Resp:    Temp: (!) 100.8 F (38.2 C) 99.3 F (37.4 C)  SpO2: 97%     Recent laboratory studies:  Lab Results  Component Value Date   HGB 10.6 (L) 12/02/2017   HGB 11.5 (L) 12/01/2017   HGB 12.8 11/19/2017   Lab Results  Component Value Date   WBC 13.7 (H) 12/02/2017   PLT 201 12/02/2017   Lab Results  Component Value Date   INR 1.01 11/19/2017    Lab Results  Component Value Date   NA 142 12/01/2017   K 3.3 (L) 12/01/2017   CL 109 12/01/2017   CO2 25 12/01/2017   BUN 11 12/01/2017   CREATININE 0.66 12/01/2017   GLUCOSE 101 (H) 12/01/2017    Discharge Medications:   Allergies as of 12/02/2017      Reactions   Sulfa Antibiotics Hives, Other (See Comments)   INTOLERANCE >  Severe headache      Medication List    STOP taking these medications   HYDROcodone-acetaminophen 10-325 MG tablet Commonly known as:  NORCO   ibuprofen 800 MG tablet Commonly known as:  ADVIL,MOTRIN   ondansetron 4 MG tablet Commonly known as:  ZOFRAN   oxyCODONE-acetaminophen 10-325 MG tablet Commonly known as:  PERCOCET     TAKE these medications   acetaminophen 500 MG tablet Commonly known as:  TYLENOL 1-2 tabs po q6hrs prn pain   apixaban 2.5 MG Tabs tablet Commonly known as:  ELIQUIS Take 1 tablet (2.5 mg total) by mouth 2 (two) times daily.   CELEBRATE CALCIUM CITRATE PO Take 1 tablet by mouth daily.   CELEBRATE MULTI-COMPLETE 18 Caps Take 1 capsule by mouth daily. CELEBRATE MULTI-ADEK CHEWABLE.   D-3-5 5000 units capsule Generic drug:  Cholecalciferol Take 5,000 Units by mouth daily. CELEBRATE D3 CHEWABLE.   ferrous sulfate 325 (65 FE) MG tablet Take 325 mg  by mouth 2 (two) times a week.   lisinopril-hydrochlorothiazide 10-12.5 MG tablet Commonly known as:  PRINZIDE,ZESTORETIC Take 1 tablet by mouth daily.   omeprazole 20 MG capsule Commonly known as:  PRILOSEC Take 20 mg by mouth daily before breakfast.   oxyCODONE 5 MG immediate release tablet Commonly known as:  ROXICODONE 1-2 tabs po q4-6hrs prn pain   tiZANidine 4 MG tablet Commonly known as:  ZANAFLEX Take 4 mg by mouth every 8 (eight) hours as needed for muscle spasms.            Durable Medical Equipment  (From admission, onward)        Start     Ordered   11/30/17 1415  DME 3 n 1  Once     11/30/17 1414   11/30/17 1415  DME Walker rolling   Once    Question:  Patient needs a walker to treat with the following condition  Answer:  Primary localized osteoarthritis of right knee   11/30/17 1414      Diagnostic Studies: Dg Chest 2 View  Result Date: 11/19/2017 CLINICAL DATA:  Preoperative evaluation for upcoming knee replacement EXAM: CHEST  2 VIEW COMPARISON:  12/29/15 FINDINGS: Cardiac shadow is within normal limits. The lungs are well aerated bilaterally. No focal infiltrate or sizable effusion is seen. Mild degenerative changes of the thoracic spine are noted. IMPRESSION: No active cardiopulmonary disease. Electronically Signed   By: Alcide Clever M.D.   On: 11/19/2017 11:57    Disposition: 01-Home or Self Care  Discharge Instructions    Discharge patient   Complete by:  As directed    Later today after therapy sessions / stairs.   Discharge disposition:  01-Home or Self Care   Discharge patient date:  12/02/2017      Follow-up Information    Frederico Hamman, MD. Schedule an appointment as soon as possible for a visit in 2 week(s).   Specialty:  Orthopedic Surgery Contact information: [address] [place] [address] [phone]        [facility] Follow up.   Specialty:  Home Health Services Why:  A representative from [facility] will contact you to arrange start date and time for your therapy.  Contact information: [address] [place] [address] [phone]            Signed: Albina Billet III PA-C 12/02/2017, 9:08 AM

## 2017-12-02 NOTE — Progress Notes (Signed)
Removed IV, provided discharge education/instructions, all questions and concerns addressed, Pt not in distress, discharged home with belongings accompanied by daughter. 

## 2017-12-02 NOTE — Plan of Care (Signed)
  Activity: Risk for activity intolerance will decrease 12/02/2017 1047 - Progressing by Darrow BussingArcilla, Grey Schlauch M, RN   Nutrition: Adequate nutrition will be maintained 12/02/2017 1047 - Progressing by Darrow BussingArcilla, Bryana Froemming M, RN   Elimination: Will not experience complications related to bowel motility 12/02/2017 1047 - Progressing by Darrow BussingArcilla, Tzvi Economou M, RN   Pain Managment: General experience of comfort will improve 12/02/2017 1047 - Progressing by Darrow BussingArcilla, Aly Hauser M, RN   Safety: Ability to remain free from injury will improve 12/02/2017 1047 - Progressing by Darrow BussingArcilla, Adir Schicker M, RN

## 2017-12-02 NOTE — Progress Notes (Signed)
    Subjective: Patient reports pain as moderate, controlled.  Tolerating diet.  Urinating.  +Flatus.  No CP, SOB.   OOB walking in hall.  Has a few steps to get into her home.  Objective:   VITALS:   Vitals:   12/01/17 1302 12/01/17 2018 12/02/17 0343 12/02/17 0528  BP: (!) 117/54 112/62 (!) 117/57   Pulse: 83 (!) 111 (!) 110   Resp: 16     Temp: 98.4 F (36.9 C) 98 F (36.7 C) (!) 100.8 F (38.2 C) 99.3 F (37.4 C)  TempSrc: Oral Oral Oral Oral  SpO2: 95% 99% 97%   Weight:      Height:       CBC Latest Ref Rng & Units 12/02/2017 12/01/2017 11/19/2017  WBC 4.0 - 10.5 K/uL 13.7(H) 10.8(H) 8.3  Hemoglobin 12.0 - 15.0 g/dL 10.6(L) 11.5(L) 12.8  Hematocrit 36.0 - 46.0 % 31.9(L) 34.9(L) 38.7  Platelets 150 - 400 K/uL 201 209 225   BMP Latest Ref Rng & Units 12/01/2017 11/19/2017 08/27/2017  Glucose 65 - 99 mg/dL 161(W101(H) 83 89  BUN 6 - 20 mg/dL 11 12 14   Creatinine 0.44 - 1.00 mg/dL 9.600.66 4.540.71 0.980.76  Sodium 135 - 145 mmol/L 142 140 143  Potassium 3.5 - 5.1 mmol/L 3.3(L) 3.5 3.4(L)  Chloride 101 - 111 mmol/L 109 105 110  CO2 22 - 32 mmol/L 25 25 26   Calcium 8.9 - 10.3 mg/dL 1.1(B8.5(L) 9.2 1.4(N8.7(L)   Intake/Output      02/02 0701 - 02/03 0700 02/03 0701 - 02/04 0700   P.O. 120    I.V. (mL/kg)     Total Intake(mL/kg) 120 (1.2)    Urine (mL/kg/hr)     Blood     Total Output     Net +120         Urine Occurrence 5 x       Physical Exam: General: NAD.  Upright in bed.  Calm, conversant.  No increased work of breathing. MSK  RLE: Neurovascularly intact Sensation intact distally Feet warm Dorsiflexion/Plantar flexion intact Incision: dressing changed.  Incision C/D/I   Assessment: 2 Days Post-Op  S/P Procedure(s) (LRB): RIGHT TOTAL KNEE ARTHROPLASTY (Right) by Dr. Madelon Lipsaffrey on 11/30/17  Active Problems:   Primary localized osteoarthritis of right knee   Right knee osteoarthritis status post R TKA Doing well postop day 2. Eating, drinking, and voiding.   Mobilizing more in  room without assistance and in hallway several times outside of therapy sessions. Has a few steps to get into her home.  Plan: Up with therapy - please work on stairs today. Incentive Spirometry - Please provide incentive spirometer. Elevate and Apply ice CPM, bone foam.  Weight Bearing: Weight Bearing as Tolerated (WBAT) RLE Dressings: Maintain Aquacel.  Please place compression stocking on operative leg before d/c. VTE prophylaxis: Eliquis, SCDs, ambulation Dispo: Home today in care of her son after therapy sessions working on stairs.    Julie KernHenry Calvin Martensen III, PA-C 12/02/2017, 9:01 AM

## 2017-12-03 ENCOUNTER — Encounter (HOSPITAL_COMMUNITY): Payer: Self-pay | Admitting: Orthopedic Surgery

## 2017-12-03 NOTE — Op Note (Signed)
NAME:  Julie Douglas, Julie Douglas                 ACCOUNT NO.:  [id number]  MEDICAL RECORD NO.:  [id number]  LOCATION:                                 FACILITY:  PHYSICIAN:  Dyke BrackettW. D. Alaysha Jefcoat, M.D.         DATE OF BIRTH:  DATE OF PROCEDURE:  11/30/2017 DATE OF DISCHARGE:  12/02/2017                              OPERATIVE REPORT   PREOPERATIVE DIAGNOSIS:  Severe osteoarthritis, right knee.  POSTOPERATIVE DIAGNOSIS:  Severe osteoarthritis, right knee.  OPERATION:  Right total knee replacement (Sigma size 4 femur, size 3, 10- mm tibia with a size 4, 10-mm rotating platform insert, and 38-mm 3 peg all-poly patella.  SURGEON:  Dyke BrackettW. D. Jarid Sasso, MD.  ASSISTANT:  Margart SicklesJoshua Chadwell, PA-C.  ANESTHESIA:  Spinal anesthetic.  TOURNIQUET TIME:  59 minutes.  DESCRIPTION OF PROCEDURE:  Spinal anesthetic, supine position, thigh tourniquet, exsanguination of leg with inflation to 375 mm.  Straight skin incision was made with medial parapatellar approach to the knee made.  We did an 11-mm 5-degree valgus cut on the femur followed by cutting about 2-3 mm below the most-diseased medial compartment. Extension gap was measured at 10 mm.  The femur was sized to be a size 4 followed by placement all-in-1 cutting block in appropriate degree of external rotation and accomplished anterior, posterior, and chamber cuts.  Excess menisci were removed from the knee.  Release of the posterior capsule, PCL with osteophytes removed, particularly from the medial side of the knee.  Flexion gap equaled the extension gap at 10 mm.  Keel hole was cut for a size 3 tibia as the tibia was somewhat smaller than the femur followed by box cut on the femur.  Trial components were inserted with full extension.  She had had a preoperative flexion contracture which resolved.  Good stability mediolaterally and no tendency for bearing spin-out and negligible drawer was noted with the knee flexed.  Patella was cut leaving about 15 mm of native  patella for an all-poly trial and then all trials placed and deemed to be acceptable.  Final components were inserted with the cement in a doughy state, tibia followed by femur and patella using a trial bearing while the cement hardened.  Mixture of Exparel and Marcaine with epinephrine was infiltrated in the capsule and subcutaneous structures.  Cement was allowed to harden.  Trial bearing was removed.  Small bits of cement were removed from posterior aspect of the knee.  Tourniquet was released.  No excessive bleeding was noted.  Final bearing was inserted. Pulsatile irrigation was used throughout the case as well.  Closure was affected with #1 Ethibond, 0 and 2-0 Vicryl and skin clips on the skin. Lightly compressive sterile dressing, knee immobilizer applied.     Dyke BrackettW. D. Jennamarie Goings, M.D.     WDC/MEDQ  D:  11/30/2017  T:  11/30/2017  Job:  [id number]

## 2017-12-27 ENCOUNTER — Ambulatory Visit (INDEPENDENT_AMBULATORY_CARE_PROVIDER_SITE_OTHER): Payer: Medicare Other | Admitting: Podiatry

## 2017-12-27 DIAGNOSIS — M21611 Bunion of right foot: Secondary | ICD-10-CM | POA: Diagnosis not present

## 2017-12-27 DIAGNOSIS — M2011 Hallux valgus (acquired), right foot: Secondary | ICD-10-CM | POA: Diagnosis not present

## 2017-12-27 DIAGNOSIS — M779 Enthesopathy, unspecified: Secondary | ICD-10-CM | POA: Diagnosis not present

## 2017-12-27 NOTE — Progress Notes (Signed)
  Subjective:  Patient ID: Julie Douglas, female    DOB: 07/19/1966,  MRN: [id number]  Chief Complaint  Patient presents with  . Hammer Toe    DOS 08/29/17 - follow up bunion and hammer toe repair right   DOS: 08/29/17 Procedure: R Scarf/Akin Osteotomy, R 2nd Weil Osteotomy, R 5th toe Hammertoe correction.  52 y.o. female returns for post-op check. Denies N/V/F/Ch. Denies pain. Walking in normal shoegear without issue. Underwent recent R knee replacement for which she is still utilizing a cane.  Objective:   General AA&O x3. Normal mood and affect.  Vascular Foot warm and well perfused.  Neurologic Gross sensation intact.  Dermatologic Skin well healed with slight hypertrophic scar. 2nd MPJ callus completely resolved.  Orthopedic: No tenderness to palpation about the right foot. Good ROM noted to the 1st MPJ.    Assessment & Plan:  Patient was evaluated and treated and all questions answered.  S/p R Scarf/Akin Osteotomy, 2nd Weil osteotomy, R 5th hammertoe correction. -Progressing as expected post-operatively. -Patient is pleased with surgical result. Would like to discuss surgical correction of her L foot to be performed in April. -Return in 4 weeks for discussion of surgical correction of her L foot bunion.  Return in about 4 weeks (around 01/24/2018).

## 2018-03-14 ENCOUNTER — Encounter

## 2018-03-14 ENCOUNTER — Ambulatory Visit (INDEPENDENT_AMBULATORY_CARE_PROVIDER_SITE_OTHER): Payer: Medicare Other | Admitting: Podiatry

## 2018-03-14 ENCOUNTER — Ambulatory Visit (INDEPENDENT_AMBULATORY_CARE_PROVIDER_SITE_OTHER): Payer: Medicare Other

## 2018-03-14 DIAGNOSIS — M21612 Bunion of left foot: Secondary | ICD-10-CM | POA: Diagnosis not present

## 2018-03-14 DIAGNOSIS — M2011 Hallux valgus (acquired), right foot: Secondary | ICD-10-CM

## 2018-03-14 DIAGNOSIS — M2012 Hallux valgus (acquired), left foot: Secondary | ICD-10-CM | POA: Diagnosis not present

## 2018-03-14 DIAGNOSIS — M21611 Bunion of right foot: Secondary | ICD-10-CM

## 2018-03-14 DIAGNOSIS — M779 Enthesopathy, unspecified: Secondary | ICD-10-CM

## 2018-04-04 ENCOUNTER — Ambulatory Visit: Payer: Medicare Other | Admitting: Podiatry

## 2018-04-16 DIAGNOSIS — R011 Cardiac murmur, unspecified: Secondary | ICD-10-CM | POA: Insufficient documentation

## 2018-04-19 ENCOUNTER — Ambulatory Visit: Payer: Medicare Other | Admitting: Podiatry

## 2018-04-19 ENCOUNTER — Other Ambulatory Visit: Payer: Self-pay

## 2018-04-26 ENCOUNTER — Encounter: Payer: Self-pay | Admitting: Podiatry

## 2018-04-26 ENCOUNTER — Ambulatory Visit (INDEPENDENT_AMBULATORY_CARE_PROVIDER_SITE_OTHER): Payer: Medicare Other | Admitting: Podiatry

## 2018-04-26 DIAGNOSIS — M7751 Other enthesopathy of right foot: Secondary | ICD-10-CM | POA: Diagnosis not present

## 2018-04-26 DIAGNOSIS — M779 Enthesopathy, unspecified: Secondary | ICD-10-CM

## 2018-04-28 NOTE — Progress Notes (Signed)
  Subjective:  Patient ID: Julie Douglas, female    DOB: 1966/04/15,  MRN: [id number]  Chief Complaint  Patient presents with  . Bunions    right foot, 2nd toe is still stiff after injection last visit   52 y.o. female returns for the above complaint.  Reports continued pain to the second toe area states that still little stiff.  Injection did help for a little bit.  Capsulitis second MPJ  Objective:  There were no vitals filed for this visit. General AA&O x3. Normal mood and affect.  Vascular Pedal pulses palpable.  Neurologic Epicritic sensation grossly intact.  Dermatologic No open lesions. Skin normal texture and turgor.  Slight hypertrophic scar second MPJ  Orthopedic:  No pain on range of motion right hallux; right first MPJ rectus without pain to palpation.  Pain palpation about hypertrophic scar of the second MPJ   Assessment & Plan:  Patient was evaluated and treated and all questions answered.  Right capsulitis second MPJ  -Injection as below  Procedure: Joint Injection Location: Right 2nd MPJ joint Skin Prep: Alcohol. Injectate: 0.5 cc 1% lidocaine plain, 0.5 cc dexamethasone phosphate. Disposition: Patient tolerated procedure well. Injection site dressed with a band-aid.   Return in about 6 weeks (around 06/07/2018) for Capsulitis.

## 2018-05-19 NOTE — Progress Notes (Signed)
  Subjective:  Patient ID: Julie Douglas, female    DOB: 08/17/1966,  MRN: [id number]  Chief Complaint  Patient presents with  . Foot Pain    DOS 08-29-17  R Scarf/Akin Osteotomy, R 2nd Weil Osteotomy, R 5th toe Hammertoe correction   DOS: 08/29/17 Procedure: R Scarf/Akin Osteotomy, R 2nd Weil Osteotomy, R 5th toe Hammertoe correction.  52 y.o. female returns for post-op check.  States she may have injured her right foot.  Stepped in a hole.  While mowing her lawn.  Now having pain in the second toe area  Objective:   General AA&O x3. Normal mood and affect.  Vascular Foot warm and well perfused.  Neurologic Gross sensation intact.  Dermatologic Skin well healed with slight hypertrophic scar. 2nd MPJ callus completely resolved.  Orthopedic: No tenderness to palpation about the right foot. Good ROM noted to the 1st MPJ. Pain palpation about the second MPJ    Assessment & Plan:  Patient was evaluated and treated and all questions answered.  S/p R Scarf/Akin Osteotomy, 2nd Weil osteotomy, R 5th hammertoe correction. -Progressing as expected post-operatively. -Patient is pleased with surgical result.  -Defer left surgical correction at this time  Capsulitis 2nd MPJ -XR taken reviewed no acute fractures -Injection as below.  Procedure: Joint Injection Location: Right 2nd MPJ joint Skin Prep: Alcohol. Injectate: 0.5 cc 1% lidocaine plain, 0.5 cc dexamethasone phosphate. Disposition: Patient tolerated procedure well. Injection site dressed with a band-aid.    Return in about 3 weeks (around 04/04/2018) for Capsulitis.

## 2018-06-07 ENCOUNTER — Encounter: Payer: Self-pay | Admitting: Podiatry

## 2018-06-07 ENCOUNTER — Ambulatory Visit (INDEPENDENT_AMBULATORY_CARE_PROVIDER_SITE_OTHER): Payer: Medicare Other | Admitting: Podiatry

## 2018-06-07 ENCOUNTER — Other Ambulatory Visit: Payer: Self-pay

## 2018-06-07 DIAGNOSIS — M2041 Other hammer toe(s) (acquired), right foot: Secondary | ICD-10-CM

## 2018-06-07 DIAGNOSIS — M6240 Contracture of muscle, unspecified site: Secondary | ICD-10-CM | POA: Diagnosis not present

## 2018-06-07 DIAGNOSIS — M205X1 Other deformities of toe(s) (acquired), right foot: Secondary | ICD-10-CM | POA: Diagnosis not present

## 2018-06-07 DIAGNOSIS — Z6837 Body mass index (BMI) 37.0-37.9, adult: Secondary | ICD-10-CM

## 2018-06-07 NOTE — Patient Instructions (Signed)
Pre-Operative Instructions  Congratulations, you have decided to take an important step towards improving your quality of life.  You can be assured that the doctors and staff at [facility] will be with you every step of the way.  Here are some important things you should know:  1. Plan to be at the surgery center/hospital at least 1 (one) hour prior to your scheduled time, unless otherwise directed by the surgical center/hospital staff.  You must have a responsible adult accompany you, remain during the surgery and drive you home.  Make sure you have directions to the surgical center/hospital to ensure you arrive on time. 2. If you are having surgery at [facility] or [facility] hospitals, you will need a copy of your medical history and physical form from your family physician within one month prior to the date of surgery. We will give you a form for your primary physician to complete.  3. We make every effort to accommodate the date you request for surgery.  However, there are times where surgery dates or times have to be moved.  We will contact you as soon as possible if a change in schedule is required.   4. No aspirin/ibuprofen for one week before surgery.  If you are on aspirin, any non-steroidal anti-inflammatory medications (Mobic, Aleve, Ibuprofen) should not be taken seven (7) days prior to your surgery.  You make take Tylenol for pain prior to surgery.  5. Medications - If you are taking daily heart and blood pressure medications, seizure, reflux, allergy, asthma, anxiety, pain or diabetes medications, make sure you notify the surgery center/hospital before the day of surgery so they can tell you which medications you should take or avoid the day of surgery. 6. No food or drink after midnight the night before surgery unless directed otherwise by surgical center/hospital staff. 7. No alcoholic beverages 24-hours prior to surgery.  No smoking 24-hours prior or 24-hours after  surgery. 8. Wear loose pants or shorts. They should be loose enough to fit over bandages, boots, and casts. 9. Don't wear slip-on shoes. Sneakers are preferred. 10. Bring your boot with you to the surgery center/hospital.  Also bring crutches or a walker if your physician has prescribed it for you.  If you do not have this equipment, it will be provided for you after surgery. 11. If you have not been contacted by the surgery center/hospital by the day before your surgery, call to confirm the date and time of your surgery. 12. Leave-time from work may vary depending on the type of surgery you have.  Appropriate arrangements should be made prior to surgery with your employer. 13. Prescriptions will be provided immediately following surgery by your doctor.  Fill these as soon as possible after surgery and take the medication as directed. Pain medications will not be refilled on weekends and must be approved by the doctor. 14. Remove nail polish on the operative foot and avoid getting pedicures prior to surgery. 15. Wash the night before surgery.  The night before surgery wash the foot and leg well with water and the antibacterial soap provided. Be sure to pay special attention to beneath the toenails and in between the toes.  Wash for at least three (3) minutes. Rinse thoroughly with water and dry well with a towel.  Perform this wash unless told not to do so by your physician.  Enclosed: 1 Ice pack (please put in freezer the night before surgery)   1 Hibiclens skin cleaner     Pre-op instructions  If you have any questions regarding the instructions, please do not hesitate to call our office.  [place]: [address], [place], [place] [address] -- [phone]  [place]: [address] [place], [place] [address] -- [phone]  [place]: [address]  [place], [place] [address] -- [phone]  [place]: [address], [address], [place], [place] [address] -- [phone]  Website:  [email or web address] 

## 2018-06-18 NOTE — Progress Notes (Signed)
  Subjective:  Patient ID: Julie Douglas, female    DOB: 18-Jan-1966,  MRN: [id number]  Chief Complaint  Patient presents with  . Foot Problem    2nd toe on the right is sticking up    52 y.o. female returns for the above complaint.  Complains the right 2nd toe is sticking up. Has irritation from her shoes. Has used padding and changed shoes without relief. No pain at the area of her bunion correction.  Objective:  There were no vitals filed for this visit. General AA&O x3. Normal mood and affect.  Vascular Pedal pulses palpable.  Neurologic Epicritic sensation grossly intact.  Dermatologic No open lesions. Skin normal texture and turgor.  Slight hypertrophic scar second MPJ  Orthopedic: No pain on range of motion right hallux; right first MPJ rectus without pain to palpation.  Pain palpation about hypertrophic scar of the second MPJ. POP 2nd toe PIPJ with dorsiflexory contracture noted of the digit.   Assessment & Plan:  Patient was evaluated and treated and all questions answered.  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 Discussed hammertoe correction with pin fixation, with tenotomy and capsulotomy for release of tendon contracture. All risks, benefits, and alternatives discussed. No guarantees given. Would consider possible concomitant scar revision    Return for post op.

## 2018-06-19 ENCOUNTER — Encounter: Payer: Self-pay | Admitting: Podiatry

## 2018-06-19 ENCOUNTER — Other Ambulatory Visit: Payer: Self-pay | Admitting: Podiatry

## 2018-06-19 DIAGNOSIS — L910 Hypertrophic scar: Secondary | ICD-10-CM

## 2018-06-19 DIAGNOSIS — M2041 Other hammer toe(s) (acquired), right foot: Secondary | ICD-10-CM | POA: Diagnosis not present

## 2018-06-19 DIAGNOSIS — M7751 Other enthesopathy of right foot: Secondary | ICD-10-CM | POA: Diagnosis not present

## 2018-06-19 DIAGNOSIS — R52 Pain, unspecified: Secondary | ICD-10-CM | POA: Diagnosis not present

## 2018-06-19 DIAGNOSIS — L905 Scar conditions and fibrosis of skin: Secondary | ICD-10-CM

## 2018-06-19 DIAGNOSIS — M6240 Contracture of muscle, unspecified site: Secondary | ICD-10-CM

## 2018-06-19 MED ORDER — PROMETHAZINE HCL 25 MG PO TABS: 25.0000 mg | ORAL_TABLET | Freq: Three times a day (TID) | ORAL | 0 refills | Status: AC | PRN

## 2018-06-19 MED ORDER — OXYCODONE-ACETAMINOPHEN 10-325 MG PO TABS: 1.0000 | ORAL_TABLET | ORAL | 0 refills | Status: DC | PRN

## 2018-06-19 MED ORDER — CEPHALEXIN 500 MG PO CAPS: 500.0000 mg | ORAL_CAPSULE | Freq: Three times a day (TID) | ORAL | 0 refills | Status: DC

## 2018-06-19 MED ORDER — PROMETHAZINE HCL 25 MG PO TABS: 25.0000 mg | ORAL_TABLET | Freq: Three times a day (TID) | ORAL | 0 refills | Status: DC | PRN

## 2018-06-19 MED ORDER — CEPHALEXIN 500 MG PO CAPS: 500.0000 mg | ORAL_CAPSULE | Freq: Three times a day (TID) | ORAL | 0 refills | Status: AC

## 2018-06-19 NOTE — Progress Notes (Signed)
[facility]  Procedures:  Correction R 2nd Hammertoe  Metatarsophalangeal Joint Tenotomy and Capsulotomy  Complex Wound Repair

## 2018-06-19 NOTE — Addendum Note (Signed)
Addended by: Ventura SellersPRICE, MICHAEL on: 06/19/2018 10:38 AM   Modules accepted: Orders

## 2018-06-19 NOTE — Progress Notes (Signed)
Rx sent to pharmacy for outpatient surgery. °

## 2018-06-21 ENCOUNTER — Ambulatory Visit (INDEPENDENT_AMBULATORY_CARE_PROVIDER_SITE_OTHER): Payer: Medicare Other | Admitting: Podiatry

## 2018-06-21 ENCOUNTER — Encounter: Payer: Self-pay | Admitting: Podiatry

## 2018-06-21 ENCOUNTER — Ambulatory Visit (INDEPENDENT_AMBULATORY_CARE_PROVIDER_SITE_OTHER): Payer: Medicare Other

## 2018-06-21 VITALS — BP 126/80 | HR 79 | Temp 97.2°F

## 2018-06-21 DIAGNOSIS — M2041 Other hammer toe(s) (acquired), right foot: Secondary | ICD-10-CM

## 2018-06-21 DIAGNOSIS — Z9889 Other specified postprocedural states: Secondary | ICD-10-CM | POA: Diagnosis not present

## 2018-06-21 NOTE — Progress Notes (Signed)
Subjective:  Patient ID: Julie Douglas, female    DOB: 1965/12/09,  MRN: [id number]  Chief Complaint  Patient presents with  . Routine Post Op    " Im doing pretty good, I am taking my pain meds as scheduled"     DOS: 06/19/18 Procedure: R 2nd Hammetoe Repair, Tenotomy and Capsulotomy with Pin Fixation, Excsision of Keloid Scar.  52 y.o. female returns for post-op check. Doing well. Only having a little bit of nausea. Pain controlled.  Review of Systems: Negative except as noted in the HPI. Denies N/V/F/Ch.  Past Medical History:  Diagnosis Date  . Arthritis   . GERD (gastroesophageal reflux disease)   . History of kidney stones   . Hypertension     Current Outpatient Medications:  .  acetaminophen (TYLENOL) 500 MG tablet, 1-2 tabs po q6hrs prn pain, Disp: 100 tablet, Rfl: 0 .  apixaban (ELIQUIS) 2.5 MG TABS tablet, Take 1 tablet (2.5 mg total) by mouth 2 (two) times daily., Disp: 24 tablet, Rfl: 0 .  Calcium Citrate-Vitamin D (CELEBRATE CALCIUM CITRATE PO), Take 1 tablet by mouth daily., Disp: , Rfl:  .  cephALEXin (KEFLEX) 500 MG capsule, Take 1 capsule (500 mg total) by mouth 3 (three) times daily., Disp: 21 capsule, Rfl: 0 .  Cholecalciferol (D-3-5) 5000 units capsule, Take 5,000 Units by mouth daily. CELEBRATE D3 CHEWABLE., Disp: , Rfl:  .  ciprofloxacin (CIPRO) 500 MG tablet, Take by mouth., Disp: , Rfl:  .  ferrous sulfate 325 (65 FE) MG tablet, Take 325 mg by mouth 2 (two) times a week., Disp: , Rfl:  .  lisinopril-hydrochlorothiazide (PRINZIDE,ZESTORETIC) 10-12.5 MG tablet, Take 1 tablet by mouth daily., Disp: , Rfl:  .  Multiple Vitamins-Minerals (CELEBRATE MULTI-COMPLETE 18) CAPS, Take 1 capsule by mouth daily. CELEBRATE MULTI-ADEK CHEWABLE., Disp: , Rfl:  .  omeprazole (PRILOSEC) 20 MG capsule, Take 20 mg by mouth daily before breakfast. , Disp: , Rfl:  .  oxyCODONE (ROXICODONE) 5 MG immediate release tablet, 1-2 tabs po q4-6hrs prn pain, Disp: 60 tablet, Rfl: 0 .   oxyCODONE-acetaminophen (PERCOCET) 10-325 MG tablet, Take 1 tablet by mouth every 4 (four) hours as needed for pain., Disp: 20 tablet, Rfl: 0 .  phentermine (ADIPEX-P) 37.5 MG tablet, Take by mouth., Disp: , Rfl:  .  promethazine (PHENERGAN) 25 MG tablet, Take 1 tablet (25 mg total) by mouth every 8 (eight) hours as needed for nausea or vomiting., Disp: 20 tablet, Rfl: 0 .  tiZANidine (ZANAFLEX) 4 MG tablet, Take 4 mg by mouth every 8 (eight) hours as needed for muscle spasms., Disp: , Rfl:   Social History   Tobacco Use  Smoking Status Former Smoker  . Last attempt to quit: 08/23/2011  . Years since quitting: 6.8  Smokeless Tobacco Never Used    Allergies  Allergen Reactions  . Sulfa Antibiotics Hives and Other (See Comments)    INTOLERANCE >  Severe headache   Objective:   Vitals:   06/21/18 0851  BP: 126/80  Pulse: 79  Temp: (!) 97.2 F (36.2 C)   There is no height or weight on file to calculate BMI. Constitutional Well developed. Well nourished.  Vascular Foot warm and well perfused. Capillary refill normal to all digits.   Neurologic Normal speech. Oriented to person, place, and time. Epicritic sensation to light touch grossly present bilaterally.  Dermatologic Skin healing well without signs of infection. Skin edges well coapted without signs of infection.  Orthopedic: Tenderness to palpation noted about the  surgical site.   Radiographs: Taken and reviewed. C/w post-op state. Assessment:   1. Hammer toe of right foot    Plan:  Patient was evaluated and treated and all questions answered.  S/p foot surgery right -Progressing as expected post-operatively. -XR: As above -WB Status: WBAT in surgical shoe -Sutures: dermabond present, skin coapted. -Medications: none -Foot redressed.  Return in about 1 week (around 06/28/2018) for price patient, keep current post op appt.

## 2018-06-24 ENCOUNTER — Telehealth: Payer: Self-pay | Admitting: *Deleted

## 2018-06-24 MED ORDER — OXYCODONE-ACETAMINOPHEN 10-325 MG PO TABS: 1.0000 | ORAL_TABLET | ORAL | 0 refills | Status: DC | PRN

## 2018-06-24 NOTE — Telephone Encounter (Signed)
"  I had surgery on the 21 of August.  I ran out of pain medication.  Can the doctor prescribe me something else?"  [facility]   I tried to transfer you but she asked me to let you know your prescription is ready for you to pick up at our office.  It will be at the front desk.  "Okay, thank you."

## 2018-06-24 NOTE — Addendum Note (Signed)
Addended by: Alphia Kava'CONNELL, Cyd Hostler D on: 06/24/2018 10:32 AM   Modules accepted: Orders

## 2018-06-24 NOTE — Telephone Encounter (Signed)
Pt request refill fo there oxycodone 10/325. Left message informing pt the pain medication prescription could be picked [place] in the [place] and taken to the pharmacy.

## 2018-06-25 NOTE — Progress Notes (Signed)
DOS  06/19/2018  Correction of right second hammer toe with pin fixation. Tenotomy and capsulotomy to release contracture.

## 2018-06-28 ENCOUNTER — Ambulatory Visit (INDEPENDENT_AMBULATORY_CARE_PROVIDER_SITE_OTHER): Payer: Medicare Other | Admitting: Podiatry

## 2018-06-28 ENCOUNTER — Encounter: Payer: Self-pay | Admitting: Podiatry

## 2018-06-28 DIAGNOSIS — Z9889 Other specified postprocedural states: Secondary | ICD-10-CM | POA: Diagnosis not present

## 2018-06-28 DIAGNOSIS — M2041 Other hammer toe(s) (acquired), right foot: Secondary | ICD-10-CM | POA: Diagnosis not present

## 2018-06-28 MED ORDER — HYDROCODONE-ACETAMINOPHEN 5-325 MG PO TABS: 1.0000 | ORAL_TABLET | Freq: Four times a day (QID) | ORAL | 0 refills | Status: AC | PRN

## 2018-06-28 NOTE — Progress Notes (Signed)
Subjective:   Patient ID: Julie Douglas, female   DOB: 52 y.o.   MRN: [id number]   HPI Patient states overall doing well with right foot with mild discomfort and swelling   ROS      Objective:  Physical Exam  Neurovascular status intact negative Homans sign noted with wound edges well coapted stitches in place in good alignment of the second digit     Assessment:  Doing well post forefoot surgery right     Plan:  Reapplied sterile dressing instructed continued elevation compression immobilization and reappoint 1 week or earlier if any issues should occur

## 2018-07-05 ENCOUNTER — Ambulatory Visit (INDEPENDENT_AMBULATORY_CARE_PROVIDER_SITE_OTHER): Payer: Medicare Other | Admitting: Podiatry

## 2018-07-05 DIAGNOSIS — Z9889 Other specified postprocedural states: Secondary | ICD-10-CM

## 2018-07-05 DIAGNOSIS — M2041 Other hammer toe(s) (acquired), right foot: Secondary | ICD-10-CM

## 2018-07-05 NOTE — Progress Notes (Signed)
Subjective:  Patient ID: Julie Douglas, female    DOB: [birth year],  MRN: [id number]  Chief Complaint  Patient presents with  . Routine Post Op    dos 08.21.2019 Hammertoe Repair 2nd Rt, Capsulotomy, MPJ Release Joint Rt   DOS: 06/19/18 Procedure: R 2nd Hammetoe Repair, Tenotomy and Capsulotomy with Pin Fixation, Excsision of Keloid Scar.  52 y.o. female returns for post-op check. Doing well. Feels like the abx she was given last time are making her sick.  Review of Systems: Negative except as noted in the HPI. Denies N/V/F/Ch.  Past Medical History:  Diagnosis Date  . Arthritis   . GERD (gastroesophageal reflux disease)   . History of kidney stones   . Hypertension     Current Outpatient Medications:  .  acetaminophen (TYLENOL) 500 MG tablet, 1-2 tabs po q6hrs prn pain, Disp: 100 tablet, Rfl: 0 .  apixaban (ELIQUIS) 2.5 MG TABS tablet, Take 1 tablet (2.5 mg total) by mouth 2 (two) times daily., Disp: 24 tablet, Rfl: 0 .  Calcium Citrate-Vitamin D (CELEBRATE CALCIUM CITRATE PO), Take 1 tablet by mouth daily., Disp: , Rfl:  .  cephALEXin (KEFLEX) 500 MG capsule, Take 1 capsule (500 mg total) by mouth 3 (three) times daily., Disp: 21 capsule, Rfl: 0 .  Cholecalciferol (D-3-5) 5000 units capsule, Take 5,000 Units by mouth daily. CELEBRATE D3 CHEWABLE., Disp: , Rfl:  .  ciprofloxacin (CIPRO) 500 MG tablet, Take by mouth., Disp: , Rfl:  .  ferrous sulfate 325 (65 FE) MG tablet, Take 325 mg by mouth 2 (two) times a week., Disp: , Rfl:  .  HYDROcodone-acetaminophen (NORCO/VICODIN) 5-325 MG tablet, Take 1 tablet by mouth every 6 (six) hours as needed for moderate pain., Disp: 30 tablet, Rfl: 0 .  lisinopril-hydrochlorothiazide (PRINZIDE,ZESTORETIC) 10-12.5 MG tablet, Take 1 tablet by mouth daily., Disp: , Rfl:  .  Multiple Vitamins-Minerals (CELEBRATE MULTI-COMPLETE 18) CAPS, Take 1 capsule by mouth daily. CELEBRATE MULTI-ADEK CHEWABLE., Disp: , Rfl:  .  omeprazole (PRILOSEC) 20 MG capsule,  Take 20 mg by mouth daily before breakfast. , Disp: , Rfl:  .  oxyCODONE (ROXICODONE) 5 MG immediate release tablet, 1-2 tabs po q4-6hrs prn pain, Disp: 60 tablet, Rfl: 0 .  oxyCODONE-acetaminophen (PERCOCET) 10-325 MG tablet, Take 1 tablet by mouth every 4 (four) hours as needed for pain., Disp: 20 tablet, Rfl: 0 .  phentermine (ADIPEX-P) 37.5 MG tablet, Take by mouth., Disp: , Rfl:  .  promethazine (PHENERGAN) 25 MG tablet, Take 1 tablet (25 mg total) by mouth every 8 (eight) hours as needed for nausea or vomiting., Disp: 20 tablet, Rfl: 0 .  tiZANidine (ZANAFLEX) 4 MG tablet, Take 4 mg by mouth every 8 (eight) hours as needed for muscle spasms., Disp: , Rfl:   Social History   Tobacco Use  Smoking Status Former Smoker  . Last attempt to quit: 08/23/2011  . Years since quitting: 6.8  Smokeless Tobacco Never Used    Allergies  Allergen Reactions  . Sulfa Antibiotics Hives and Other (See Comments)    INTOLERANCE >  Severe headache   Objective:   There were no vitals filed for this visit. There is no height or weight on file to calculate BMI. Constitutional Well developed. Well nourished.  Vascular Foot warm and well perfused. Capillary refill normal to all digits.   Neurologic Normal speech. Oriented to person, place, and time. Epicritic sensation to light touch grossly present bilaterally.  Dermatologic Skin healing well without signs of infection. Skin edges  well coapted without signs of infection. Pin intact without signs of infection.  Orthopedic: Tenderness to palpation noted about the surgical site.   Radiographs: Taken and reviewed. C/w post-op state. Assessment:   1. Post-operative state   2. Hammer toe of right foot    Plan:  Patient was evaluated and treated and all questions answered.  S/p foot surgery right -Progressing as expected post-operatively. -XR: As above -WB Status: WBAT in surgical shoe -Sutures: dermabond present, skin coapted.  -Medications:  none -Foot redressed. -Plan for XR and pin removal in 2 weeks.  Return in about 2 weeks (around 07/19/2018) for Post-op.

## 2018-07-11 NOTE — Telephone Encounter (Signed)
Post op check see documentation

## 2018-07-18 ENCOUNTER — Ambulatory Visit (INDEPENDENT_AMBULATORY_CARE_PROVIDER_SITE_OTHER): Payer: Medicare Other | Admitting: Podiatry

## 2018-07-18 ENCOUNTER — Ambulatory Visit (INDEPENDENT_AMBULATORY_CARE_PROVIDER_SITE_OTHER): Payer: Medicare Other

## 2018-07-18 DIAGNOSIS — M2041 Other hammer toe(s) (acquired), right foot: Secondary | ICD-10-CM | POA: Diagnosis not present

## 2018-07-18 DIAGNOSIS — Z9889 Other specified postprocedural states: Secondary | ICD-10-CM

## 2018-07-18 NOTE — Progress Notes (Signed)
Subjective:  Patient ID: Julie Douglas, female    DOB: 18-Oct-1966,  MRN: [id number]  Chief Complaint  Patient presents with  . Routine Post Op     dos 08.21.2019 Hammertoe Repair 2nd Rt, Capsulotomy, MPJ Release Joint Rt" my foot is feeling better"    DOS: 06/19/18 Procedure: R 2nd Hammetoe Repair, Tenotomy and Capsulotomy with Pin Fixation, Excsision of Keloid Scar.  52 y.o. female returns for post-op check. Doing well.  Nervous about getting the pin pulled from her foot.  Review of Systems: Negative except as noted in the HPI. Denies N/V/F/Ch.  Past Medical History:  Diagnosis Date  . Arthritis   . GERD (gastroesophageal reflux disease)   . History of kidney stones   . Hypertension     Current Outpatient Medications:  .  acetaminophen (TYLENOL) 500 MG tablet, 1-2 tabs po q6hrs prn pain, Disp: 100 tablet, Rfl: 0 .  apixaban (ELIQUIS) 2.5 MG TABS tablet, Take 1 tablet (2.5 mg total) by mouth 2 (two) times daily., Disp: 24 tablet, Rfl: 0 .  Calcium Citrate-Vitamin D (CELEBRATE CALCIUM CITRATE PO), Take 1 tablet by mouth daily., Disp: , Rfl:  .  cephALEXin (KEFLEX) 500 MG capsule, Take 1 capsule (500 mg total) by mouth 3 (three) times daily., Disp: 21 capsule, Rfl: 0 .  Cholecalciferol (D-3-5) 5000 units capsule, Take 5,000 Units by mouth daily. CELEBRATE D3 CHEWABLE., Disp: , Rfl:  .  ciprofloxacin (CIPRO) 500 MG tablet, Take by mouth., Disp: , Rfl:  .  ferrous sulfate 325 (65 FE) MG tablet, Take 325 mg by mouth 2 (two) times a week., Disp: , Rfl:  .  HYDROcodone-acetaminophen (NORCO/VICODIN) 5-325 MG tablet, Take 1 tablet by mouth every 6 (six) hours as needed for moderate pain., Disp: 30 tablet, Rfl: 0 .  lisinopril-hydrochlorothiazide (PRINZIDE,ZESTORETIC) 10-12.5 MG tablet, Take 1 tablet by mouth daily., Disp: , Rfl:  .  Multiple Vitamins-Minerals (CELEBRATE MULTI-COMPLETE 18) CAPS, Take 1 capsule by mouth daily. CELEBRATE MULTI-ADEK CHEWABLE., Disp: , Rfl:  .  omeprazole  (PRILOSEC) 20 MG capsule, Take 20 mg by mouth daily before breakfast. , Disp: , Rfl:  .  oxyCODONE (ROXICODONE) 5 MG immediate release tablet, 1-2 tabs po q4-6hrs prn pain, Disp: 60 tablet, Rfl: 0 .  oxyCODONE-acetaminophen (PERCOCET) 10-325 MG tablet, Take 1 tablet by mouth every 4 (four) hours as needed for pain., Disp: 20 tablet, Rfl: 0 .  phentermine (ADIPEX-P) 37.5 MG tablet, Take by mouth., Disp: , Rfl:  .  promethazine (PHENERGAN) 25 MG tablet, Take 1 tablet (25 mg total) by mouth every 8 (eight) hours as needed for nausea or vomiting., Disp: 20 tablet, Rfl: 0 .  tiZANidine (ZANAFLEX) 4 MG tablet, Take 4 mg by mouth every 8 (eight) hours as needed for muscle spasms., Disp: , Rfl:   Social History   Tobacco Use  Smoking Status Former Smoker  . Last attempt to quit: 08/23/2011  . Years since quitting: 6.9  Smokeless Tobacco Never Used    Allergies  Allergen Reactions  . Sulfa Antibiotics Hives and Other (See Comments)    INTOLERANCE >  Severe headache   Objective:   There were no vitals filed for this visit. There is no height or weight on file to calculate BMI. Constitutional Well developed. Well nourished.  Vascular Foot warm and well perfused. Capillary refill normal to all digits.   Neurologic Normal speech. Oriented to person, place, and time. Epicritic sensation to light touch grossly present bilaterally.  Dermatologic Skin well-healed with intact Dermabond  Pin intact without signs of infection.  Orthopedic: Tenderness to palpation noted about the surgical site.   Radiographs: Taken and reviewed. C/w post-op state.  Toe arthrodesis site appears bridge Assessment:   1. Post-operative state    Plan:  Patient was evaluated and treated and all questions answered.  S/p foot surgery right -Progressing as expected post-operatively. -XR: As above -WB Status: WBAT in surgical shoe -Sutures: dermabond present, skin coapted.  -Medications: none -Foot  redressed. -Pin pulled today.  Return in about 2 weeks (around 08/01/2018) for Post-op.  Will get x-rays and plan to transition to normal shoe gear

## 2018-08-01 ENCOUNTER — Ambulatory Visit (INDEPENDENT_AMBULATORY_CARE_PROVIDER_SITE_OTHER): Payer: Medicare Other | Admitting: Podiatry

## 2018-08-01 ENCOUNTER — Ambulatory Visit (INDEPENDENT_AMBULATORY_CARE_PROVIDER_SITE_OTHER): Payer: Medicare Other

## 2018-08-01 ENCOUNTER — Other Ambulatory Visit: Payer: Self-pay | Admitting: Podiatry

## 2018-08-01 VITALS — BP 122/75 | HR 65 | Temp 97.2°F

## 2018-08-01 DIAGNOSIS — Z9889 Other specified postprocedural states: Secondary | ICD-10-CM

## 2018-08-01 DIAGNOSIS — M79672 Pain in left foot: Secondary | ICD-10-CM

## 2018-08-01 DIAGNOSIS — M2041 Other hammer toe(s) (acquired), right foot: Secondary | ICD-10-CM

## 2018-08-01 NOTE — Progress Notes (Signed)
Subjective:  Patient ID: Julie Douglas, female    DOB: 09-10-1966,  MRN: [id number]  Chief Complaint  Patient presents with  . Routine Post Op    DOS 06/19/18 Hammertoe Repair 2nd RT; Capsulotomy MPJ Release Joint RT; healing well. Wound is clean, no pain or swelling. Pt stated, "I have no pain"   DOS: 06/19/18 Procedure: R 2nd Hammetoe Repair, Tenotomy and Capsulotomy with Pin Fixation, Excsision of Keloid Scar.  52 y.o. female returns for post-op check. Doing well. Presents in normal shoegear though she was told to wear her surgical shoe.  Review of Systems: Negative except as noted in the HPI. Denies N/V/F/Ch.  Past Medical History:  Diagnosis Date  . Arthritis   . GERD (gastroesophageal reflux disease)   . History of kidney stones   . Hypertension     Current Outpatient Medications:  .  acetaminophen (TYLENOL) 500 MG tablet, 1-2 tabs po q6hrs prn pain, Disp: 100 tablet, Rfl: 0 .  apixaban (ELIQUIS) 2.5 MG TABS tablet, Take 1 tablet (2.5 mg total) by mouth 2 (two) times daily., Disp: 24 tablet, Rfl: 0 .  Calcium Citrate-Vitamin D (CELEBRATE CALCIUM CITRATE PO), Take 1 tablet by mouth daily., Disp: , Rfl:  .  cephALEXin (KEFLEX) 500 MG capsule, Take 1 capsule (500 mg total) by mouth 3 (three) times daily., Disp: 21 capsule, Rfl: 0 .  Cholecalciferol (D-3-5) 5000 units capsule, Take 5,000 Units by mouth daily. CELEBRATE D3 CHEWABLE., Disp: , Rfl:  .  ciprofloxacin (CIPRO) 500 MG tablet, Take by mouth., Disp: , Rfl:  .  ferrous sulfate 325 (65 FE) MG tablet, Take 325 mg by mouth 2 (two) times a week., Disp: , Rfl:  .  HYDROcodone-acetaminophen (NORCO/VICODIN) 5-325 MG tablet, Take 1 tablet by mouth every 6 (six) hours as needed for moderate pain., Disp: 30 tablet, Rfl: 0 .  lisinopril-hydrochlorothiazide (PRINZIDE,ZESTORETIC) 10-12.5 MG tablet, Take 1 tablet by mouth daily., Disp: , Rfl:  .  Multiple Vitamin (MULTI-VITAMINS) TABS, Take by mouth., Disp: , Rfl:  .  Multiple  Vitamins-Minerals (CELEBRATE MULTI-COMPLETE 18) CAPS, Take 1 capsule by mouth daily. CELEBRATE MULTI-ADEK CHEWABLE., Disp: , Rfl:  .  omeprazole (PRILOSEC) 20 MG capsule, Take 20 mg by mouth daily before breakfast. , Disp: , Rfl:  .  oxyCODONE (ROXICODONE) 5 MG immediate release tablet, 1-2 tabs po q4-6hrs prn pain, Disp: 60 tablet, Rfl: 0 .  oxyCODONE-acetaminophen (PERCOCET) 10-325 MG tablet, Take 1 tablet by mouth every 4 (four) hours as needed for pain., Disp: 20 tablet, Rfl: 0 .  phentermine (ADIPEX-P) 37.5 MG tablet, Take by mouth., Disp: , Rfl:  .  promethazine (PHENERGAN) 25 MG tablet, Take 1 tablet (25 mg total) by mouth every 8 (eight) hours as needed for nausea or vomiting., Disp: 20 tablet, Rfl: 0 .  tiZANidine (ZANAFLEX) 4 MG tablet, Take 4 mg by mouth every 8 (eight) hours as needed for muscle spasms., Disp: , Rfl:   Social History   Tobacco Use  Smoking Status Former Smoker  . Last attempt to quit: 08/23/2011  . Years since quitting: 6.9  Smokeless Tobacco Never Used    Allergies  Allergen Reactions  . Sulfa Antibiotics Hives and Other (See Comments)    INTOLERANCE >  Severe headache   Objective:   Vitals:   08/01/18 0844  BP: 122/75  Pulse: 65  Temp: (!) 97.2 F (36.2 C)   There is no height or weight on file to calculate BMI. Constitutional Well developed. Well nourished.  Vascular Foot  warm and well perfused. Capillary refill normal to all digits.   Neurologic Normal speech. Oriented to person, place, and time. Epicritic sensation to light touch grossly present bilaterally.  Dermatologic Skin well-healed.  Orthopedic: Tenderness to palpation noted about the surgical site. Slight elevation of the 2nd toe remains.   Radiographs: Taken reviewed consistent with postop state.  No definite evidence of bridging across the proximal interphalangeal joint.  Assessment:   1. Foot pain, left    Plan:  Patient was evaluated and treated and all questions  answered.  S/p foot surgery right -Progressing as expected post-operatively. -XR: As above -Toe appears more elevated today. Discussed continued taping in plantar flexion. -WB Status: WBAT in normal shoegear. -Medications: none  Return in about 6 weeks (around 09/12/2018) for Post-op. XR at that time.

## 2018-08-23 ENCOUNTER — Ambulatory Visit (INDEPENDENT_AMBULATORY_CARE_PROVIDER_SITE_OTHER): Payer: Medicare Other | Admitting: Podiatry

## 2018-08-23 ENCOUNTER — Encounter: Payer: Self-pay | Admitting: Podiatry

## 2018-08-23 DIAGNOSIS — L910 Hypertrophic scar: Secondary | ICD-10-CM

## 2018-08-23 DIAGNOSIS — Z9889 Other specified postprocedural states: Secondary | ICD-10-CM

## 2018-08-27 NOTE — Progress Notes (Signed)
Subjective:  Patient ID: Julie Douglas, female    DOB: [birth year],  MRN: [id number]  Chief Complaint  Patient presents with  . Routine Post Op    DOS: 06/19/2018 Hammertoe Repair 2nd R, Capsulotomy, MPJ Joint R; pt stated, "not doing any better; pain is still as bad as before surgery; foot is still tender to touch"   DOS: 06/19/18 Procedure: R 2nd Hammetoe Repair, Tenotomy and Capsulotomy with Pin Fixation, Excsision of Keloid Scar.  52 y.o. female returns for post-op check. Concerned the toe is coming up slightly and the pain is back to what it was. Has not been taping the toe.  Review of Systems: Negative except as noted in the HPI. Denies N/V/F/Ch.  Past Medical History:  Diagnosis Date  . Arthritis   . GERD (gastroesophageal reflux disease)   . History of kidney stones   . Hypertension     Current Outpatient Medications:  .  acetaminophen (TYLENOL) 500 MG tablet, 1-2 tabs po q6hrs prn pain, Disp: 100 tablet, Rfl: 0 .  apixaban (ELIQUIS) 2.5 MG TABS tablet, Take 1 tablet (2.5 mg total) by mouth 2 (two) times daily., Disp: 24 tablet, Rfl: 0 .  Calcium Citrate-Vitamin D (CELEBRATE CALCIUM CITRATE PO), Take 1 tablet by mouth daily., Disp: , Rfl:  .  cephALEXin (KEFLEX) 500 MG capsule, Take 1 capsule (500 mg total) by mouth 3 (three) times daily., Disp: 21 capsule, Rfl: 0 .  Cholecalciferol (D-3-5) 5000 units capsule, Take 5,000 Units by mouth daily. CELEBRATE D3 CHEWABLE., Disp: , Rfl:  .  ciprofloxacin (CIPRO) 500 MG tablet, Take by mouth., Disp: , Rfl:  .  ferrous sulfate 325 (65 FE) MG tablet, Take 325 mg by mouth 2 (two) times a week., Disp: , Rfl:  .  HYDROcodone-acetaminophen (NORCO/VICODIN) 5-325 MG tablet, Take 1 tablet by mouth every 6 (six) hours as needed for moderate pain., Disp: 30 tablet, Rfl: 0 .  lisinopril-hydrochlorothiazide (PRINZIDE,ZESTORETIC) 10-12.5 MG tablet, Take 1 tablet by mouth daily., Disp: , Rfl:  .  Multiple Vitamin (MULTI-VITAMINS) TABS, Take by mouth.,  Disp: , Rfl:  .  Multiple Vitamins-Minerals (CELEBRATE MULTI-COMPLETE 18) CAPS, Take 1 capsule by mouth daily. CELEBRATE MULTI-ADEK CHEWABLE., Disp: , Rfl:  .  omeprazole (PRILOSEC) 20 MG capsule, Take 20 mg by mouth daily before breakfast. , Disp: , Rfl:  .  oxyCODONE (ROXICODONE) 5 MG immediate release tablet, 1-2 tabs po q4-6hrs prn pain, Disp: 60 tablet, Rfl: 0 .  oxyCODONE-acetaminophen (PERCOCET) 10-325 MG tablet, Take 1 tablet by mouth every 4 (four) hours as needed for pain., Disp: 20 tablet, Rfl: 0 .  phentermine (ADIPEX-P) 37.5 MG tablet, Take by mouth., Disp: , Rfl:  .  promethazine (PHENERGAN) 25 MG tablet, Take 1 tablet (25 mg total) by mouth every 8 (eight) hours as needed for nausea or vomiting., Disp: 20 tablet, Rfl: 0 .  tiZANidine (ZANAFLEX) 4 MG tablet, Take 4 mg by mouth every 8 (eight) hours as needed for muscle spasms., Disp: , Rfl:   Social History   Tobacco Use  Smoking Status Former Smoker  . Last attempt to quit: 08/23/2011  . Years since quitting: 7.0  Smokeless Tobacco Never Used    Allergies  Allergen Reactions  . Sulfa Antibiotics Hives and Other (See Comments)    INTOLERANCE >  Severe headache   Objective:   There were no vitals filed for this visit. There is no height or weight on file to calculate BMI. Constitutional Well developed. Well nourished.  Vascular Foot warm  and well perfused. Capillary refill normal to all digits.   Neurologic Normal speech. Oriented to person, place, and time. Epicritic sensation to light touch grossly present bilaterally.  Dermatologic Skin well-healed.  Orthopedic: Tenderness to palpation noted about the surgical site. Slight elevation of the 2nd toe remains.   Radiographs: None  Assessment:   1. Post-operative state   2. Keloid scar    Plan:  Patient was evaluated and treated and all questions answered.  S/p foot surgery right -Toe appears elevated, likely 2/2 keloid scar formation. Gentle stretching  today allowed the toe to sit more naturally. Dispensed weil control strap and educated on use. Discussed importance of stretching the toe to allow it to sit more naturally. -WB Status: WBAT in normal shoegear. -Medications: none  Return in about 4 weeks (around 09/20/2018) for Capsulitis, hammertoe right 2nd toe.

## 2018-09-12 ENCOUNTER — Encounter: Payer: Medicare Other | Admitting: Podiatry

## 2018-11-22 ENCOUNTER — Encounter: Payer: Self-pay | Admitting: Podiatry

## 2018-11-22 ENCOUNTER — Ambulatory Visit (INDEPENDENT_AMBULATORY_CARE_PROVIDER_SITE_OTHER): Payer: Medicare Other | Admitting: Podiatry

## 2018-11-22 DIAGNOSIS — G5763 Lesion of plantar nerve, bilateral lower limbs: Secondary | ICD-10-CM

## 2018-11-22 DIAGNOSIS — B351 Tinea unguium: Secondary | ICD-10-CM

## 2018-11-22 DIAGNOSIS — M79609 Pain in unspecified limb: Secondary | ICD-10-CM | POA: Diagnosis not present

## 2018-11-22 NOTE — Patient Instructions (Signed)
Morton Neuralgia    Morton neuralgia is foot pain that affects the ball of the foot and the area near the toes. Morton neuralgia occurs when part of a nerve in the foot (digital nerve) is under too much pressure (compressed). When this happens over a long period of time, the nerve can thicken (neuroma) and cause pain. Pain usually occurs between the third and fourth toes.   Morton neuralgia can come and go but may get worse over time.  What are the causes?  This condition is caused by doing the same things over and over with your foot, such as:  · Activities such as running or jumping.  · Wearing shoes that are too tight.  What increases the risk?  You may be at higher risk for Morton neuralgia if you:  · Are female.  · Wear high heels.  · Wear shoes that are narrow or tight.  · Do activities that repeatedly stretch your toes, such as:  ? Running.  ? Ballet.  ? Long-distance walking.  What are the signs or symptoms?  The first symptom of Morton neuralgia is pain that spreads from the ball of the foot to the toes. It may feel like you are walking on a marble. Pain usually gets worse with walking and goes away at night. Other symptoms may include numbness and cramping of your toes. Both feet are equally affected, but rarely at the same time.  How is this diagnosed?  This condition is diagnosed based on your symptoms, your medical history, and a physical exam. Your health care provider may:  · Squeeze your foot just behind your toe.  · Ask you to move your toes to check for pain.  · Ask about your physical activity level.  You also may have imaging tests, such as an X-ray, ultrasound, or MRI.  How is this treated?  Treatment depends on how severe your condition is and what causes it. Treatment may involve:  · Wearing different shoes that are not too tight, are low-heeled, and provide good support. For some people, this is the only treatment needed.  · Wearing an over-the-counter or custom supportive pad (orthotic)  under the front of your foot.  · Getting injections of numbing medicine and anti-inflammatory medicine (steroid) in the nerve.  · Having surgery to remove part of the thickened nerve.  Follow these instructions at home:  Managing pain, stiffness, and swelling    · Massage your foot as needed.  · Wear orthotics as told by your health care provider.  · If directed, put ice on your foot:  ? Put ice in a plastic bag.  ? Place a towel between your skin and the bag.  ? Leave the ice on for 20 minutes, 2-3 times a day.  · Avoid activities that cause pain or make pain worse. If you play sports, ask your health care provider when it is safe for you to return to sports.  · Raise (elevate) your foot above the level of your heart while lying down and, when possible, while sitting.  General instructions  · Take over-the-counter and prescription medicines only as told by your health care provider.  · Do not drive or use heavy machinery while taking prescription pain medicine.  · Wear shoes that:  ? Have soft soles.  ? Have a wide toe area.  ? Provide arch support.  ? Do not pinch or squeeze your feet.  ? Have room for your orthotics, if applicable.  ·   Keep all follow-up visits as told by your health care provider. This is important.  Contact a health care provider if:  · Your symptoms get worse or do not get better with treatment and home care.  Summary  · Morton neuralgia is foot pain that affects the ball of the foot and the area near the toes. Pain usually occurs between the third and fourth toes, gets worse with walking, and goes away at night.  · Morton neuralgia occurs when part of a nerve in the foot (digital nerve) is under too much pressure. When this happens over a long period of time, the nerve can thicken (neuroma) and cause pain.  · This condition is caused by doing the same things over and over with your foot, such as running or jumping, wearing shoes that are too tight, or wearing high heels.  · Treatment may  involve wearing low-heeled shoes that are not too tight, wearing a supportive pad (orthotic) under the front of your foot, getting injections in the nerve, or having surgery to remove part of the thickened nerve.  This information is not intended to replace advice given to you by your health care provider. Make sure you discuss any questions you have with your health care provider.  Document Released: 01/22/2001 Document Revised: 10/30/2017 Document Reviewed: 10/30/2017  [employer] Interactive Patient Education © 2019 [employer]

## 2018-11-22 NOTE — Progress Notes (Signed)
Subjective:  Patient ID: Julie Douglas, female    DOB: 04/22/1966,  MRN: [id number]  Chief Complaint  Patient presents with  . Nail Problem    4th and 5th toenails are painful    53 y.o. female presents with the above complaint. Reports numbness and burning to the bottom of both feet in the 4th toe areas. Present for several weeks. Also complains of thickening and long toenails she cannot care for herself that cause pain in her shoes.  Review of Systems: Negative except as noted in the HPI. Denies N/V/F/Ch.  Past Medical History:  Diagnosis Date  . Arthritis   . GERD (gastroesophageal reflux disease)   . History of kidney stones   . Hypertension     Current Outpatient Medications:  .  acetaminophen (TYLENOL) 500 MG tablet, 1-2 tabs po q6hrs prn pain, Disp: 100 tablet, Rfl: 0 .  apixaban (ELIQUIS) 2.5 MG TABS tablet, Take 1 tablet (2.5 mg total) by mouth 2 (two) times daily., Disp: 24 tablet, Rfl: 0 .  Calcium Citrate-Vitamin D (CELEBRATE CALCIUM CITRATE PO), Take 1 tablet by mouth daily., Disp: , Rfl:  .  cephALEXin (KEFLEX) 500 MG capsule, Take 1 capsule (500 mg total) by mouth 3 (three) times daily., Disp: 21 capsule, Rfl: 0 .  Cholecalciferol (D-3-5) 5000 units capsule, Take 5,000 Units by mouth daily. CELEBRATE D3 CHEWABLE., Disp: , Rfl:  .  ciprofloxacin (CIPRO) 500 MG tablet, Take by mouth., Disp: , Rfl:  .  ferrous sulfate 325 (65 FE) MG tablet, Take 325 mg by mouth 2 (two) times a week., Disp: , Rfl:  .  HYDROcodone-acetaminophen (NORCO/VICODIN) 5-325 MG tablet, Take 1 tablet by mouth every 6 (six) hours as needed for moderate pain., Disp: 30 tablet, Rfl: 0 .  lisinopril-hydrochlorothiazide (PRINZIDE,ZESTORETIC) 10-12.5 MG tablet, Take 1 tablet by mouth daily., Disp: , Rfl:  .  Multiple Vitamin (MULTI-VITAMINS) TABS, Take by mouth., Disp: , Rfl:  .  Multiple Vitamins-Minerals (CELEBRATE MULTI-COMPLETE 18) CAPS, Take 1 capsule by mouth daily. CELEBRATE MULTI-ADEK CHEWABLE.,  Disp: , Rfl:  .  omeprazole (PRILOSEC) 20 MG capsule, Take 20 mg by mouth daily before breakfast. , Disp: , Rfl:  .  oxyCODONE (ROXICODONE) 5 MG immediate release tablet, 1-2 tabs po q4-6hrs prn pain, Disp: 60 tablet, Rfl: 0 .  oxyCODONE-acetaminophen (PERCOCET) 10-325 MG tablet, Take 1 tablet by mouth every 4 (four) hours as needed for pain., Disp: 20 tablet, Rfl: 0 .  phentermine (ADIPEX-P) 37.5 MG tablet, Take by mouth., Disp: , Rfl:  .  promethazine (PHENERGAN) 25 MG tablet, Take 1 tablet (25 mg total) by mouth every 8 (eight) hours as needed for nausea or vomiting., Disp: 20 tablet, Rfl: 0 .  tiZANidine (ZANAFLEX) 4 MG tablet, Take 4 mg by mouth every 8 (eight) hours as needed for muscle spasms., Disp: , Rfl:   Social History   Tobacco Use  Smoking Status Former Smoker  . Last attempt to quit: 08/23/2011  . Years since quitting: 7.2  Smokeless Tobacco Never Used    Allergies  Allergen Reactions  . Sulfa Antibiotics Hives and Other (See Comments)    INTOLERANCE >  Severe headache   Objective:  There were no vitals filed for this visit. There is no height or weight on file to calculate BMI. Constitutional Well developed. Well nourished.  Vascular Dorsalis pedis pulses palpable bilaterally. Posterior tibial pulses palpable bilaterally. Capillary refill normal to all digits.  No cyanosis or clubbing noted. Pedal hair growth normal.  Neurologic Normal  speech. Oriented to person, place, and time. Epicritic sensation to light touch grossly present bilaterally.  Dermatologic Nails elongated with pain on palpation No open wounds. No skin lesions.  Orthopedic: POP 3rd IS bilat with Mulder's click   Radiographs: None today Assessment:   1. Morton's metatarsalgia, neuralgia, or neuroma, bilateral   2. Pain due to onychomycosis of nail    Plan:  Patient was evaluated and treated and all questions answered.  Interdigital Neuroma, bilaterally -Educated on etiology -Interspace  injection delivered as below. -Educated on padding and proper shoegear  Procedure: Neuroma Injection Location: Bilateral 3rd interspace Skin Prep: Alcohol. Injectate: 0.5 cc 0.5% marcaine plain, 0.5 cc dexamethasone phosphate. Disposition: Patient tolerated procedure well. Injection site dressed with a band-aid.  Onyhcomycosis with Pain -Nails debrided x10  Procedure: Nail Debridement Rationale: pain Type of Debridement: manual, sharp debridement. Instrumentation: Nail nipper, rotary burr. Number of Nails: 10   Return in about 4 weeks (around 12/20/2018) for Neuromas, Bilateral injeciton f/u.

## 2018-12-26 ENCOUNTER — Ambulatory Visit: Payer: Medicare Other | Admitting: Podiatry

## 2019-01-16 ENCOUNTER — Ambulatory Visit: Payer: Medicare Other | Admitting: Skilled Nursing Facility1

## 2019-01-28 IMAGING — CR DG CHEST 2V
2 series · 2 of 2 positions shown · non-contrast
Comparison: 12/29/15

CLINICAL DATA: Preoperative evaluation for upcoming knee
replacement

EXAM:
CHEST  2 VIEW

[w chest pa]
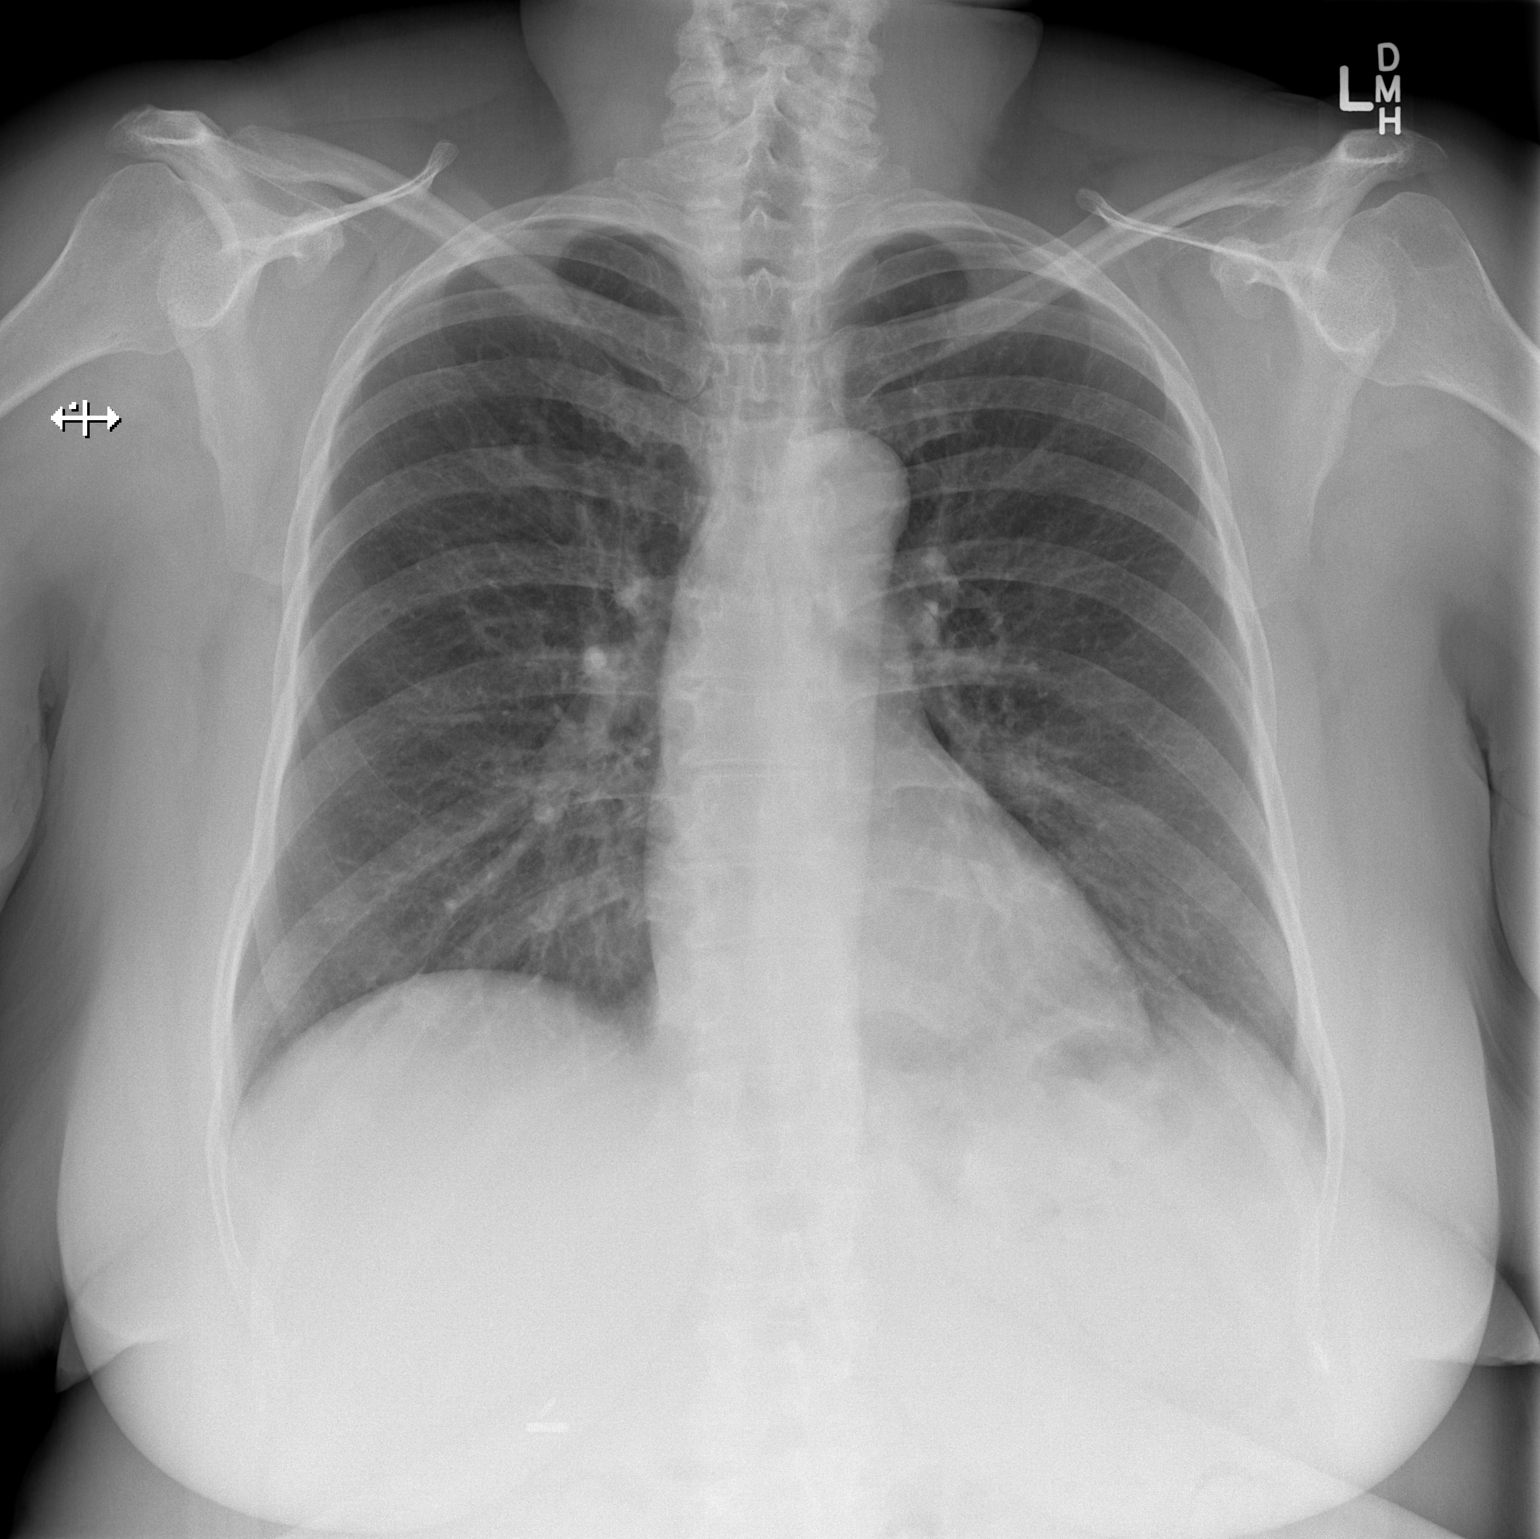

[w chest lat]
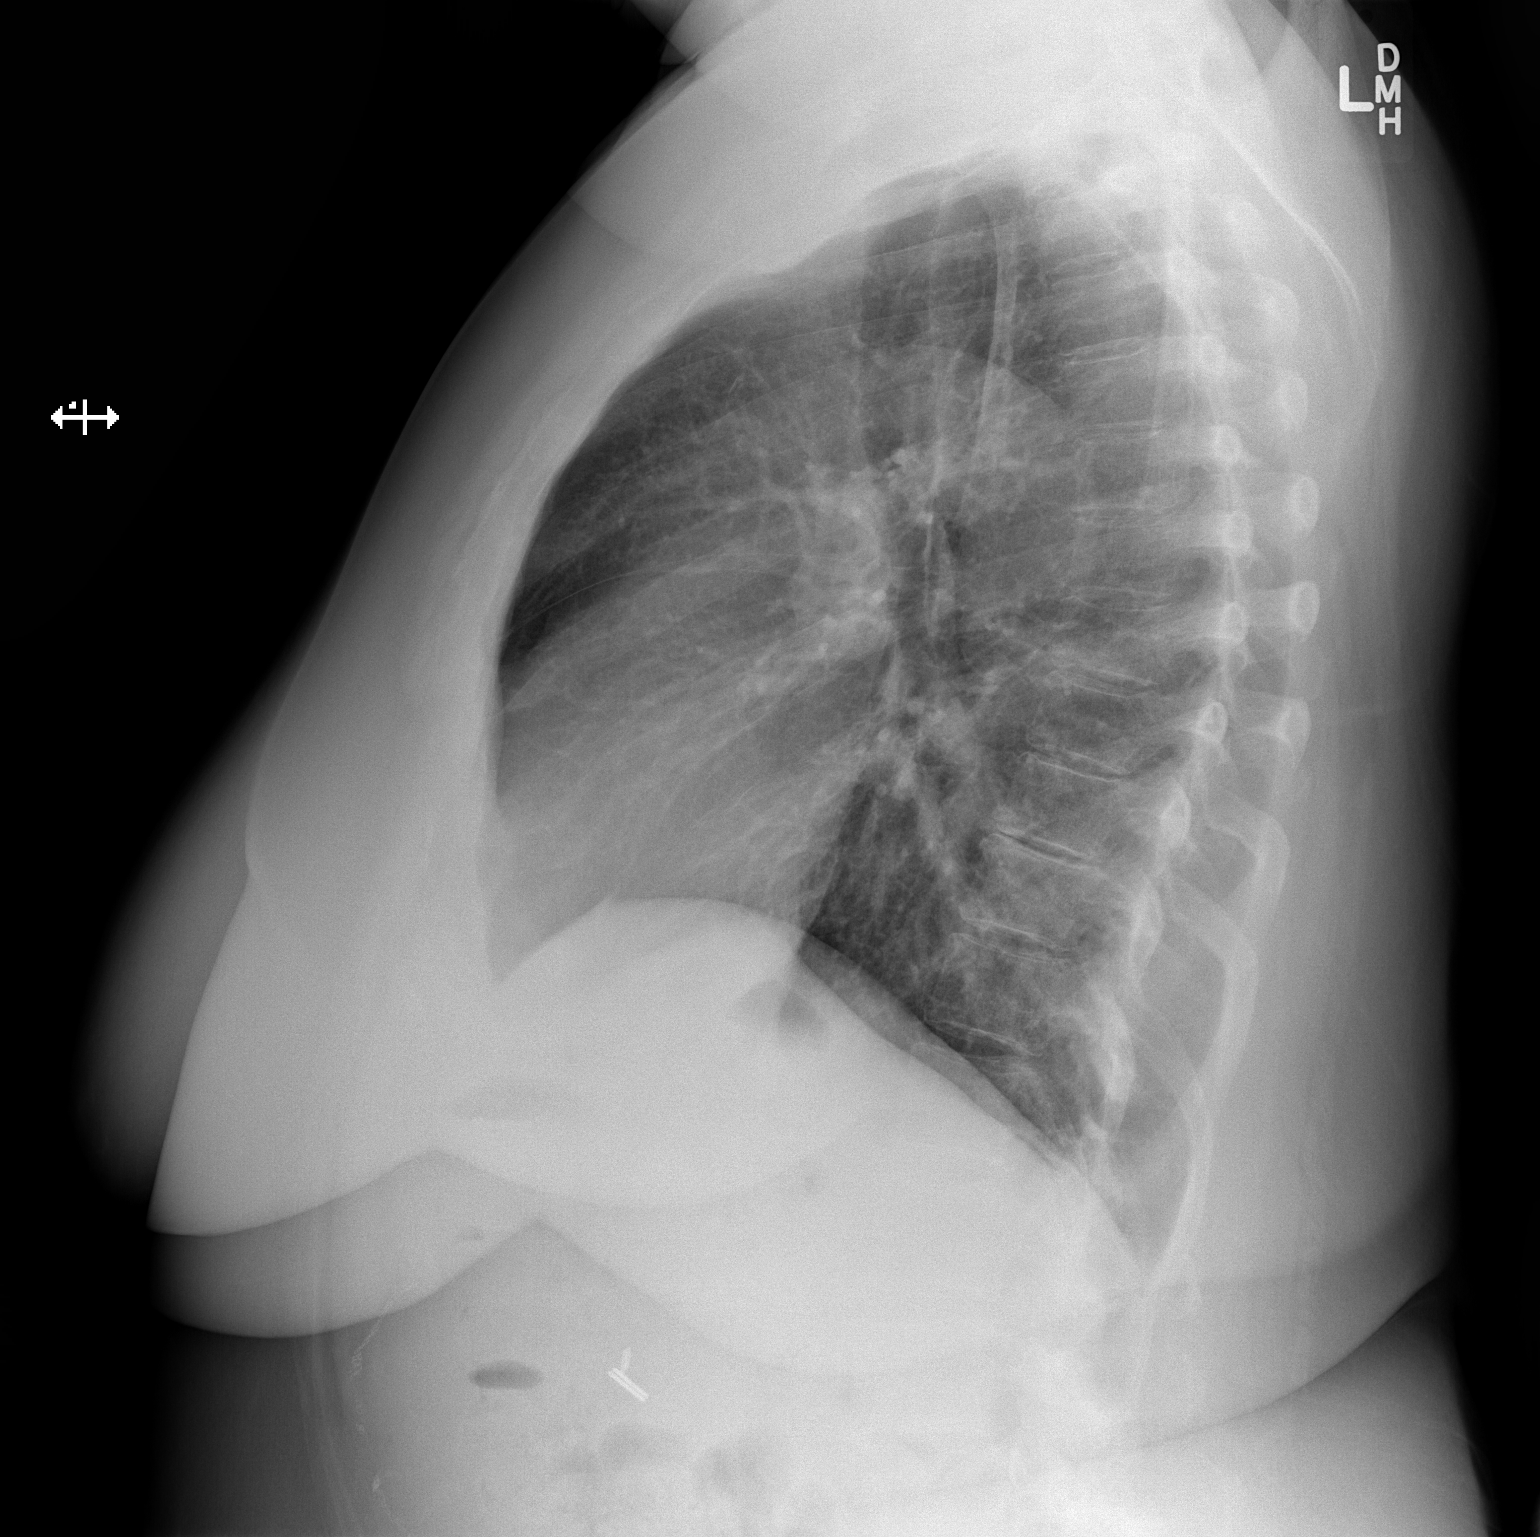

[2 of 2 positions shown; findings below may reference images not displayed]

FINDINGS: Cardiac shadow is within normal limits. The lungs are well aerated
bilaterally. No focal infiltrate or sizable effusion is seen. Mild
degenerative changes of the thoracic spine are noted.
IMPRESSION: No active cardiopulmonary disease.

## 2019-01-29 ENCOUNTER — Ambulatory Visit: Payer: Medicare Other | Admitting: Skilled Nursing Facility1

## 2019-06-17 ENCOUNTER — Encounter: Payer: Self-pay | Admitting: Podiatry

## 2019-06-17 NOTE — Progress Notes (Signed)
Requested medical records were placed up front to be mailed out tomorrow to patient.

## 2020-04-09 ENCOUNTER — Ambulatory Visit: Payer: Medicare Other | Admitting: Podiatry

## 2020-04-15 ENCOUNTER — Ambulatory Visit: Payer: Medicare Other | Admitting: Podiatry

## 2020-10-25 ENCOUNTER — Ambulatory Visit: Payer: Medicare Other

## 2020-10-25 ENCOUNTER — Ambulatory Visit (INDEPENDENT_AMBULATORY_CARE_PROVIDER_SITE_OTHER): Payer: Medicare Other

## 2020-10-25 ENCOUNTER — Ambulatory Visit (INDEPENDENT_AMBULATORY_CARE_PROVIDER_SITE_OTHER): Payer: Medicare Other | Admitting: Podiatry

## 2020-10-25 ENCOUNTER — Other Ambulatory Visit: Payer: Self-pay

## 2020-10-25 DIAGNOSIS — M2011 Hallux valgus (acquired), right foot: Secondary | ICD-10-CM | POA: Diagnosis not present

## 2020-10-25 DIAGNOSIS — Z01818 Encounter for other preprocedural examination: Secondary | ICD-10-CM | POA: Diagnosis not present

## 2020-10-25 DIAGNOSIS — M2041 Other hammer toe(s) (acquired), right foot: Secondary | ICD-10-CM

## 2020-10-25 DIAGNOSIS — M24574 Contracture, right foot: Secondary | ICD-10-CM

## 2020-10-26 ENCOUNTER — Encounter: Payer: Self-pay | Admitting: Podiatry

## 2020-10-26 NOTE — Progress Notes (Signed)
Subjective:  Patient ID: Julie Douglas, female    DOB: [birth year],  MRN: [id number]  Chief Complaint  Patient presents with  . Foot Pain    B.L/ pt states she has been having issue with right foot since surgery- mentioned she has knots on the bottom of foot- pt is stating her left foot has started to cause pain as well, further evaluation     54 y.o. female presents with the above complaint.  Patient presents with a complaint of right second digit recurrence of hammertoe as well as right bunion deformity recurrence.  Patient states that is still very painful and has come back.  Patient states the second toe is now sitting up and there appears to be contracture of scarring.  Patient had previous hammertoe and bunion deformity corrected by Dr. Samuella Cota however overall she is not happy with the surgery as it came back.  She would like for me to rediscuss surgical options to revise the bunion as well as the hammertoe.  She denies any other acute complaints is causing her a lot of pain and she would like to redo the bunion and the second digit hammertoe.   Review of Systems: Negative except as noted in the HPI. Denies N/V/F/Ch.  Past Medical History:  Diagnosis Date  . Arthritis   . GERD (gastroesophageal reflux disease)   . History of kidney stones   . Hypertension     Current Outpatient Medications:  .  Liraglutide -Weight Management (SAXENDA) 18 MG/3ML SOPN, Inject into the skin., Disp: , Rfl:  .  acetaminophen (TYLENOL) 500 MG tablet, 1-2 tabs po q6hrs prn pain, Disp: 100 tablet, Rfl: 0 .  apixaban (ELIQUIS) 2.5 MG TABS tablet, Take 1 tablet (2.5 mg total) by mouth 2 (two) times daily., Disp: 24 tablet, Rfl: 0 .  Calcium Citrate-Vitamin D (CELEBRATE CALCIUM CITRATE PO), Take 1 tablet by mouth daily., Disp: , Rfl:  .  cephALEXin (KEFLEX) 500 MG capsule, Take 1 capsule (500 mg total) by mouth 3 (three) times daily., Disp: 21 capsule, Rfl: 0 .  Cholecalciferol (D-3-5) 5000 units capsule, Take  5,000 Units by mouth daily. CELEBRATE D3 CHEWABLE., Disp: , Rfl:  .  ciprofloxacin (CIPRO) 500 MG tablet, Take by mouth., Disp: , Rfl:  .  ferrous sulfate 325 (65 FE) MG tablet, Take 325 mg by mouth 2 (two) times a week., Disp: , Rfl:  .  HYDROcodone-acetaminophen (NORCO/VICODIN) 5-325 MG tablet, Take 1 tablet by mouth every 6 (six) hours as needed for moderate pain., Disp: 30 tablet, Rfl: 0 .  lisinopril-hydrochlorothiazide (PRINZIDE,ZESTORETIC) 10-12.5 MG tablet, Take 1 tablet by mouth daily., Disp: , Rfl:  .  Multiple Vitamin (MULTI-VITAMINS) TABS, Take by mouth., Disp: , Rfl:  .  Multiple Vitamins-Minerals (CELEBRATE MULTI-COMPLETE 18) CAPS, Take 1 capsule by mouth daily. CELEBRATE MULTI-ADEK CHEWABLE., Disp: , Rfl:  .  omeprazole (PRILOSEC) 20 MG capsule, Take 20 mg by mouth daily before breakfast. , Disp: , Rfl:  .  oxyCODONE (ROXICODONE) 5 MG immediate release tablet, 1-2 tabs po q4-6hrs prn pain, Disp: 60 tablet, Rfl: 0 .  oxyCODONE-acetaminophen (PERCOCET) 10-325 MG tablet, Take 1 tablet by mouth every 4 (four) hours as needed for pain., Disp: 20 tablet, Rfl: 0 .  phentermine (ADIPEX-P) 37.5 MG tablet, Take by mouth., Disp: , Rfl:  .  promethazine (PHENERGAN) 25 MG tablet, Take 1 tablet (25 mg total) by mouth every 8 (eight) hours as needed for nausea or vomiting., Disp: 20 tablet, Rfl: 0 .  tiZANidine (ZANAFLEX)  4 MG tablet, Take 4 mg by mouth every 8 (eight) hours as needed for muscle spasms., Disp: , Rfl:   Social History   Tobacco Use  Smoking Status Former Smoker  . Quit date: 08/23/2011  . Years since quitting: 9.1  Smokeless Tobacco Never Used    Allergies  Allergen Reactions  . Sulfa Antibiotics Hives and Other (See Comments)    INTOLERANCE >  Severe headache   Objective:  There were no vitals filed for this visit. There is no height or weight on file to calculate BMI. Constitutional Well developed. Well nourished.  Vascular Dorsalis pedis pulses palpable  bilaterally. Posterior tibial pulses palpable bilaterally. Capillary refill normal to all digits.  No cyanosis or clubbing noted. Pedal hair growth normal.  Neurologic Normal speech. Oriented to person, place, and time. Epicritic sensation to light touch grossly present bilaterally.  Dermatologic Nails well groomed and normal in appearance. No open wounds. No skin lesions.  Orthopedic:  Hammertoe contracture noted of the right second digit with contracture of the metatarsophalangeal joint.  Retrograde pressure on the second metatarsal head is leading to plantarflexed metatarsal therefore submetatarsal 2 hyperkeratotic lesion.  Pain on palpation to the medial hypertrophy of the bunion deformity from recurrence.  Hallux valgus deformity noted.  No tarsometatarsal joint hypermobility noted.   Radiographs: 3 views of skeletally mature adult right foot: Previous hardware noted which appears to be intact however there appears to be screw sizes that are along.  There is a recurrence of bunion deformity noted with moderate increase in IM angle.  There is increasing hallux abductor's angle as well.  Weil osteotomy noted as well.  Hammertoe contracture with elevatus of the second metatarsophalangeal joint noted.  Assessment:   1. Hammertoe of second toe of right foot   2. Hallux abducto valgus, right   3. Joint contracture of foot, right   4. Preoperative examination    Plan:  Patient was evaluated and treated and all questions answered.  Right second digit hammertoe with MPJ contracture and submetatarsal 2 hyperkeratotic lesion -I explained to the patient the etiology of hammertoe and the recurrence associated with it.  Given that patient also has a scar contracture as well as recurrence of the hammertoe contracture with involvement of the metatarsophalangeal joint contracture I believe patient will benefit from revisional surgery to address the toe as well as the metatarsophalangeal joint  contracture.  I believe patient will benefit from excision of the joint contracture as well as release with capsulotomy of the MPJ with possible removal of the Weil osteotomy screw and revising the Weil osteotomy.  Also discussed with her that I may need to insert a K wire to hold it in position and I may need to address the plantar plate as well.  Patient states understanding and would like to proceed with the surgery despite all the risks and complication.  I discussed with her that this is a revisional surgery as the chances of recurrence are much higher.  I also discussed with her that we may not get the correction that we need given that this is a revisional surgery.  Patient states understanding would like to proceed with the surgery   Right bunion deformity recurrence -I explained to the patient the etiology of bunion deformity and various treatment options were extensively discussed.  Given that patient has previous hardware that will need to be removed I discussed with her that I plan on doing all hardware removal followed by redoing the chevron osteotomy  with 2 screws of fixation.  I believe that there may have been a component of noncompliance a lead to recurrence of the bunion deformity postsurgically from previous surgery.  I discussed with her my postop protocol in extensive detail that includes weightbearing as tolerated in a boot she states understanding and will do so. -I discussed with her that my surgical plan as well as my incision placement in extensive detail.  Patient states understanding and I will plan on using the previous incision thus on the medial side. -Informed surgical risk consent was reviewed and read aloud to the patient.  I reviewed the films.  I have discussed my findings with the patient in great detail.  I have discussed all risks including but not limited to infection, stiffness, scarring, limp, disability, deformity, damage to blood vessels and nerves, numbness, poor  healing, need for braces, arthritis, chronic pain, amputation, death.  All benefits and realistic expectations discussed in great detail.  I have made no promises as to the outcome.  I have provided realistic expectations.  I have offered the patient a 2nd opinion, which they have declined and assured me they preferred to proceed despite the risks -A total of 35 minutes was spent in direct patient care as well as pre and post patient encounter activities.  This includes documentation as well as reviewing patient chart for labs, imaging, past medical, surgical, social, and family history as documented in the EMR.  I have reviewed medication allergies as documented in EMR.  I discussed the etiology of condition and treatment options from conservative to surgical care.  All risks and benefit of the treatment course was discussed in detail.  All questions were answered and return appointment was discussed.  Since the visit completed in an ambulatory/outpatient setting, the patient and/or parent/guardian has been advised to contact the providers office for worsening condition and seek medical treatment and/or call 911 if the patient deems either is necessary.   No follow-ups on file.

## 2020-11-11 ENCOUNTER — Telehealth: Payer: Self-pay

## 2020-11-11 NOTE — Telephone Encounter (Signed)
DOS 11/22/2020  [place] BUNIONECTOMY RT - 82574 DOUBLE OSTEOTOMY RT - 28299 CAPSULOTOMY, MPJ RELEASE JOINT 2ND RT - 28270 METATARSAL OSTEOTOMY RT - 28308 HAMMERTOE REPAIR 2ND RT - 28285  [facility] MEDICARE EFFECTIVE DATE - 10/30/2020  PLAN DEDUCTIBLE - $0.00 OUT OF POCKET - $7550.00 W/ $7550.00 REMAINING  CO-INSURANCE 20% / Day OUTPATIENT SURGERY 20% / Day OUTPATIENT HOSPITAL COPAY $0 / Day OUTPATIENT SURGERY $0 / Day OUTPATIENT HOSPITAL   Notification or Prior Authorization is not required for the requested services  This [employer]vant[employer] members plan does not currently require a prior authorization for these services. If you have general questions about the prior authorization requirements, please call us at [phone] or visit [email or web address] > Clinician Resources > Advance and Admission Notification Requirements. The number above acknowledges your notification. Please write this number down for future reference. Notification is not a guarantee of coverage or payment.  Decision ID #:[id number]

## 2020-11-22 ENCOUNTER — Encounter: Payer: Self-pay | Admitting: Podiatry

## 2020-11-22 ENCOUNTER — Telehealth: Payer: Self-pay | Admitting: Podiatry

## 2020-11-22 DIAGNOSIS — M2041 Other hammer toe(s) (acquired), right foot: Secondary | ICD-10-CM | POA: Diagnosis not present

## 2020-11-22 DIAGNOSIS — Z4889 Encounter for other specified surgical aftercare: Secondary | ICD-10-CM | POA: Diagnosis not present

## 2020-11-22 DIAGNOSIS — M2011 Hallux valgus (acquired), right foot: Secondary | ICD-10-CM

## 2020-11-22 MED ORDER — IBUPROFEN 800 MG PO TABS
800.0000 mg | ORAL_TABLET | Freq: Four times a day (QID) | ORAL | 1 refills | Status: DC | PRN
Start: 2020-11-22 — End: 2023-03-02

## 2020-11-22 MED ORDER — TRAMADOL HCL 50 MG PO TABS: 50.0000 mg | ORAL_TABLET | Freq: Three times a day (TID) | ORAL | 0 refills | Status: DC | PRN

## 2020-11-22 NOTE — Addendum Note (Signed)
Addended by: Nicholes Rough on: 11/22/2020 08:00 AM   Modules accepted: Orders

## 2020-11-22 NOTE — Telephone Encounter (Signed)
Pt's Rx of Ibuprofen 800 MG tablet and Tramadol 50 MG tablet need to be sent to [facility] [phone]. Rx's were sent to [facility] [phone] but patient wants them sent to [facility] [phone].

## 2020-11-23 ENCOUNTER — Ambulatory Visit (INDEPENDENT_AMBULATORY_CARE_PROVIDER_SITE_OTHER): Payer: Medicare Other | Admitting: Podiatry

## 2020-11-23 ENCOUNTER — Other Ambulatory Visit: Payer: Self-pay

## 2020-11-23 DIAGNOSIS — M2041 Other hammer toe(s) (acquired), right foot: Secondary | ICD-10-CM

## 2020-11-23 DIAGNOSIS — M2011 Hallux valgus (acquired), right foot: Secondary | ICD-10-CM

## 2020-11-23 DIAGNOSIS — M24574 Contracture, right foot: Secondary | ICD-10-CM

## 2020-11-23 MED ORDER — OXYCODONE HCL 5 MG PO TABS: 5.0000 mg | ORAL_TABLET | Freq: Four times a day (QID) | ORAL | 0 refills | Status: AC | PRN

## 2020-11-23 MED ORDER — TRAMADOL HCL 50 MG PO TABS: 50.0000 mg | ORAL_TABLET | Freq: Three times a day (TID) | ORAL | 0 refills | Status: DC | PRN

## 2020-11-23 MED ORDER — IBUPROFEN 800 MG PO TABS: 800.0000 mg | ORAL_TABLET | Freq: Four times a day (QID) | ORAL | 1 refills | Status: DC | PRN

## 2020-11-23 NOTE — Telephone Encounter (Signed)
Called pt to let her know that her prescriptions were sent to [id number]. Pt proceeded to tell me that she is coming in at 10:15 this am due to having a possible tight bandage with possible blood and will be seeing Dr. Lilian Kapur. Pt states the pain is bad and she would like to get Oxycodone as she cannot take or do well taking Percocet. I told the pt to discuss her pain with both the assistant and Dr. Lilian Kapur and they would go from there.

## 2020-11-23 NOTE — Patient Instructions (Signed)
Medications:  Take 800mg  ibuprofen every 8 hours  Take 1,000mg  (2 extra strength) tylenol every 6 hours (take between ibuprofen doses)  Take 5mg  oxycodone every 6 hours as needed for pain

## 2020-11-23 NOTE — Addendum Note (Signed)
Addended by: Nicholes Rough on: 11/23/2020 07:53 AM   Modules accepted: Orders

## 2020-11-23 NOTE — Telephone Encounter (Signed)
Done

## 2020-11-25 ENCOUNTER — Encounter: Payer: Self-pay | Admitting: Podiatry

## 2020-11-25 NOTE — Progress Notes (Signed)
  Subjective:  Patient ID: Julie Douglas, female    DOB: Nov 29, 1965,  MRN: [id number]  Chief Complaint  Patient presents with  . Routine Post Op    PT stated that she had surgery done yesterday but was concerned about the amount of blood coming through her bandages.     DOS: 11/22/2020 with Dr. Allena Katz Procedure: Bunionectomy and second hammertoe correction  55 y.o. female returns for post-op check.  Presents for urgent visit because she had bleeding in her bandage.  Also having pain is not controlled by the tramadol she is taking  Review of Systems: Negative except as noted in the HPI. Denies N/V/F/Ch.   Objective:  There were no vitals filed for this visit. There is no height or weight on file to calculate BMI. Constitutional Well developed. Well nourished.  Vascular Foot warm and well perfused. Capillary refill normal to all digits.   Neurologic Normal speech. Oriented to person, place, and time. Epicritic sensation to light touch grossly present bilaterally.  Dermatologic  no signs of infection.  There is some small bleeding and gapping of the dorsal second MTP incision  Orthopedic: Tenderness to palpation noted about the surgical site.  Significant edema to the right foot   Assessment:   1. Hallux abducto valgus, right   2. Hammertoe of second toe of right foot   3. Joint contracture of foot, right    Plan:  Patient was evaluated and treated and all questions answered.  S/p foot surgery right -Emphasized portance of edema control with compression and keeping it iced and elevated -I applied Steri-Strips and reinforced the incision where the bleeding was occurring -Continue CAM boot -She will follow up with Dr. Allena Katz at her next visit -Increased her postoperative pain medication to oxycodone 5 mg every 6 hours as needed.  Also advised to take the Motrin as directed as well as Tylenol 1000 mg every 6 hours for multimodal analgesia  No follow-ups on file.

## 2020-12-01 ENCOUNTER — Ambulatory Visit: Payer: Medicare Other

## 2020-12-01 ENCOUNTER — Other Ambulatory Visit: Payer: Self-pay

## 2020-12-01 ENCOUNTER — Encounter: Payer: Self-pay | Admitting: Podiatry

## 2020-12-01 ENCOUNTER — Ambulatory Visit (INDEPENDENT_AMBULATORY_CARE_PROVIDER_SITE_OTHER): Payer: Medicare Other | Admitting: Podiatry

## 2020-12-01 DIAGNOSIS — M24574 Contracture, right foot: Secondary | ICD-10-CM

## 2020-12-01 DIAGNOSIS — M2041 Other hammer toe(s) (acquired), right foot: Secondary | ICD-10-CM

## 2020-12-01 DIAGNOSIS — Z9889 Other specified postprocedural states: Secondary | ICD-10-CM

## 2020-12-01 DIAGNOSIS — M2011 Hallux valgus (acquired), right foot: Secondary | ICD-10-CM

## 2020-12-02 ENCOUNTER — Encounter: Payer: Self-pay | Admitting: Podiatry

## 2020-12-02 NOTE — Progress Notes (Signed)
Subjective:  Patient ID: Julie Douglas, female    DOB: 1966-02-04,  MRN: [id number]  Chief Complaint  Patient presents with  . Routine Post Op    POV #1 DOS 11/22/2020 RT REVISIONAL BUNIONECTOMY W/REMOVAL OF ALL HARDWARE W/REVISIONAL HAMMERTOE 2ND W/CAPSULOTOMY & WEIL OSTEOTOMY        55 y.o. female returns for post-op check. Patient states she is doing well. She is still having some pain. The pain is controlled with pain medication. She denies any other acute complaints.  Review of Systems: Negative except as noted in the HPI. Denies N/V/F/Ch.  Past Medical History:  Diagnosis Date  . Arthritis   . GERD (gastroesophageal reflux disease)   . History of kidney stones   . Hypertension     Current Outpatient Medications:  .  acetaminophen (TYLENOL) 500 MG tablet, 1-2 tabs po q6hrs prn pain, Disp: 100 tablet, Rfl: 0 .  apixaban (ELIQUIS) 2.5 MG TABS tablet, Take 1 tablet (2.5 mg total) by mouth 2 (two) times daily., Disp: 24 tablet, Rfl: 0 .  Calcium Citrate-Vitamin D (CELEBRATE CALCIUM CITRATE PO), Take 1 tablet by mouth daily., Disp: , Rfl:  .  cephALEXin (KEFLEX) 500 MG capsule, Take 1 capsule (500 mg total) by mouth 3 (three) times daily., Disp: 21 capsule, Rfl: 0 .  Cholecalciferol (D-3-5) 5000 units capsule, Take 5,000 Units by mouth daily. CELEBRATE D3 CHEWABLE., Disp: , Rfl:  .  ciprofloxacin (CIPRO) 500 MG tablet, Take by mouth., Disp: , Rfl:  .  ferrous sulfate 325 (65 FE) MG tablet, Take 325 mg by mouth 2 (two) times a week., Disp: , Rfl:  .  HYDROcodone-acetaminophen (NORCO/VICODIN) 5-325 MG tablet, Take 1 tablet by mouth every 6 (six) hours as needed for moderate pain., Disp: 30 tablet, Rfl: 0 .  ibuprofen (ADVIL) 800 MG tablet, Take 1 tablet (800 mg total) by mouth every 6 (six) hours as needed., Disp: 60 tablet, Rfl: 1 .  ibuprofen (ADVIL) 800 MG tablet, Take 1 tablet (800 mg total) by mouth every 6 (six) hours as needed., Disp: 60 tablet, Rfl: 1 .  Liraglutide -Weight  Management (SAXENDA) 18 MG/3ML SOPN, Inject into the skin., Disp: , Rfl:  .  lisinopril-hydrochlorothiazide (PRINZIDE,ZESTORETIC) 10-12.5 MG tablet, Take 1 tablet by mouth daily., Disp: , Rfl:  .  Multiple Vitamin (MULTI-VITAMINS) TABS, Take by mouth., Disp: , Rfl:  .  Multiple Vitamins-Minerals (CELEBRATE MULTI-COMPLETE 18) CAPS, Take 1 capsule by mouth daily. CELEBRATE MULTI-ADEK CHEWABLE., Disp: , Rfl:  .  omeprazole (PRILOSEC) 20 MG capsule, Take 20 mg by mouth daily before breakfast. , Disp: , Rfl:  .  phentermine (ADIPEX-P) 37.5 MG tablet, Take by mouth., Disp: , Rfl:  .  promethazine (PHENERGAN) 25 MG tablet, Take 1 tablet (25 mg total) by mouth every 8 (eight) hours as needed for nausea or vomiting., Disp: 20 tablet, Rfl: 0 .  tiZANidine (ZANAFLEX) 4 MG tablet, Take 4 mg by mouth every 8 (eight) hours as needed for muscle spasms., Disp: , Rfl:   Social History   Tobacco Use  Smoking Status Former Smoker  . Quit date: 08/23/2011  . Years since quitting: 9.2  Smokeless Tobacco Never Used    Allergies  Allergen Reactions  . Sulfa Antibiotics Hives and Other (See Comments)    INTOLERANCE >  Severe headache   Objective:  There were no vitals filed for this visit. There is no height or weight on file to calculate BMI. Constitutional Well developed. Well nourished.  Vascular Foot warm and  well perfused. Capillary refill normal to all digits.   Neurologic Normal speech. Oriented to person, place, and time. Epicritic sensation to light touch grossly present bilaterally.  Dermatologic Skin healing well without signs of infection. Skin edges well coapted without signs of infection.  Orthopedic: Tenderness to palpation noted about the surgical site.   Radiographs: None Assessment:   1. Hallux abducto valgus, right   2. Hammertoe of second toe of right foot   3. Joint contracture of foot, right   4. Status post right foot surgery    Plan:  Patient was evaluated and treated and  all questions answered.  S/p foot surgery right -Progressing as expected post-operatively. -XR: None -WB Status: Weightbearing as tolerated in cam boot -Sutures: Intact. No signs of dehiscence noted. No signs of complications noted. -Medications: None -Foot redressed.  No follow-ups on file.

## 2020-12-15 ENCOUNTER — Other Ambulatory Visit: Payer: Self-pay

## 2020-12-15 ENCOUNTER — Encounter: Payer: Self-pay | Admitting: Podiatry

## 2020-12-15 ENCOUNTER — Ambulatory Visit (INDEPENDENT_AMBULATORY_CARE_PROVIDER_SITE_OTHER): Payer: Medicare Other | Admitting: Podiatry

## 2020-12-15 DIAGNOSIS — M2041 Other hammer toe(s) (acquired), right foot: Secondary | ICD-10-CM

## 2020-12-15 DIAGNOSIS — M2011 Hallux valgus (acquired), right foot: Secondary | ICD-10-CM

## 2020-12-15 DIAGNOSIS — Z9889 Other specified postprocedural states: Secondary | ICD-10-CM

## 2020-12-15 NOTE — Progress Notes (Signed)
Subjective:  Patient ID: Julie Douglas, female    DOB: October 28, 1966,  MRN: [id number]  Chief Complaint  Patient presents with  . Routine Post Op    POST OP hallux abducto valgus      55 y.o. female returns for post-op check. Patient states she is doing well.  Her pain is being controlled.  She has gone on her pain medication.  She denies any other acute complaints.  Review of Systems: Negative except as noted in the HPI. Denies N/V/F/Ch.  Past Medical History:  Diagnosis Date  . Arthritis   . GERD (gastroesophageal reflux disease)   . History of kidney stones   . Hypertension     Current Outpatient Medications:  .  acetaminophen (TYLENOL) 500 MG tablet, 1-2 tabs po q6hrs prn pain, Disp: 100 tablet, Rfl: 0 .  apixaban (ELIQUIS) 2.5 MG TABS tablet, Take 1 tablet (2.5 mg total) by mouth 2 (two) times daily., Disp: 24 tablet, Rfl: 0 .  Calcium Citrate-Vitamin D (CELEBRATE CALCIUM CITRATE PO), Take 1 tablet by mouth daily., Disp: , Rfl:  .  cephALEXin (KEFLEX) 500 MG capsule, Take 1 capsule (500 mg total) by mouth 3 (three) times daily., Disp: 21 capsule, Rfl: 0 .  Cholecalciferol (D-3-5) 5000 units capsule, Take 5,000 Units by mouth daily. CELEBRATE D3 CHEWABLE., Disp: , Rfl:  .  ciprofloxacin (CIPRO) 500 MG tablet, Take by mouth., Disp: , Rfl:  .  ferrous sulfate 325 (65 FE) MG tablet, Take 325 mg by mouth 2 (two) times a week., Disp: , Rfl:  .  HYDROcodone-acetaminophen (NORCO/VICODIN) 5-325 MG tablet, Take 1 tablet by mouth every 6 (six) hours as needed for moderate pain., Disp: 30 tablet, Rfl: 0 .  ibuprofen (ADVIL) 800 MG tablet, Take 1 tablet (800 mg total) by mouth every 6 (six) hours as needed., Disp: 60 tablet, Rfl: 1 .  ibuprofen (ADVIL) 800 MG tablet, Take 1 tablet (800 mg total) by mouth every 6 (six) hours as needed., Disp: 60 tablet, Rfl: 1 .  Liraglutide -Weight Management (SAXENDA) 18 MG/3ML SOPN, Inject into the skin., Disp: , Rfl:  .  lisinopril-hydrochlorothiazide  (PRINZIDE,ZESTORETIC) 10-12.5 MG tablet, Take 1 tablet by mouth daily., Disp: , Rfl:  .  Multiple Vitamin (MULTI-VITAMINS) TABS, Take by mouth., Disp: , Rfl:  .  Multiple Vitamins-Minerals (CELEBRATE MULTI-COMPLETE 18) CAPS, Take 1 capsule by mouth daily. CELEBRATE MULTI-ADEK CHEWABLE., Disp: , Rfl:  .  omeprazole (PRILOSEC) 20 MG capsule, Take 20 mg by mouth daily before breakfast. , Disp: , Rfl:  .  phentermine (ADIPEX-P) 37.5 MG tablet, Take by mouth., Disp: , Rfl:  .  promethazine (PHENERGAN) 25 MG tablet, Take 1 tablet (25 mg total) by mouth every 8 (eight) hours as needed for nausea or vomiting., Disp: 20 tablet, Rfl: 0 .  tiZANidine (ZANAFLEX) 4 MG tablet, Take 4 mg by mouth every 8 (eight) hours as needed for muscle spasms., Disp: , Rfl:   Social History   Tobacco Use  Smoking Status Former Smoker  . Quit date: 08/23/2011  . Years since quitting: 9.3  Smokeless Tobacco Never Used    Allergies  Allergen Reactions  . Sulfa Antibiotics Hives and Other (See Comments)    INTOLERANCE >  Severe headache   Objective:  There were no vitals filed for this visit. There is no height or weight on file to calculate BMI. Constitutional Well developed. Well nourished.  Vascular Foot warm and well perfused. Capillary refill normal to all digits.   Neurologic Normal speech.  Oriented to person, place, and time. Epicritic sensation to light touch grossly present bilaterally.  Dermatologic Skin healing well without signs of infection. Skin edges well coapted without signs of infection.  Orthopedic: Tenderness to palpation noted about the surgical site.   Radiographs: None Assessment:   1. Hallux abducto valgus, right   2. Hammertoe of second toe of right foot   3. Status post right foot surgery    Plan:  Patient was evaluated and treated and all questions answered.  S/p foot surgery right -Progressing as expected post-operatively. -XR: None -WB Status: Weightbearing as tolerated in  cam boot -Sutures: Removed no signs of dehiscence noted. No signs of complications noted. -Medications: None -Foot redressed.  No follow-ups on file.

## 2020-12-29 ENCOUNTER — Ambulatory Visit (INDEPENDENT_AMBULATORY_CARE_PROVIDER_SITE_OTHER): Payer: Medicare Other | Admitting: Podiatry

## 2020-12-29 ENCOUNTER — Other Ambulatory Visit: Payer: Self-pay

## 2020-12-29 ENCOUNTER — Encounter: Payer: Self-pay | Admitting: Podiatry

## 2020-12-29 DIAGNOSIS — Z9889 Other specified postprocedural states: Secondary | ICD-10-CM

## 2020-12-29 DIAGNOSIS — M2041 Other hammer toe(s) (acquired), right foot: Secondary | ICD-10-CM

## 2020-12-29 DIAGNOSIS — M2011 Hallux valgus (acquired), right foot: Secondary | ICD-10-CM

## 2020-12-29 NOTE — Progress Notes (Signed)
Subjective:  Patient ID: Julie Douglas, female    DOB: Oct 03, 1966,  MRN: [id number]  Chief Complaint  Patient presents with  . Routine Post Op    PT stated that she is having pain where the pin is at      55 y.o. female returns for post-op check. Patient states she is doing well. She does not have much pain. Overall she is get some numbness tingling pain. Otherwise no acute complaints.  Review of Systems: Negative except as noted in the HPI. Denies N/V/F/Ch.  Past Medical History:  Diagnosis Date  . Arthritis   . GERD (gastroesophageal reflux disease)   . History of kidney stones   . Hypertension     Current Outpatient Medications:  .  acetaminophen (TYLENOL) 500 MG tablet, 1-2 tabs po q6hrs prn pain, Disp: 100 tablet, Rfl: 0 .  apixaban (ELIQUIS) 2.5 MG TABS tablet, Take 1 tablet (2.5 mg total) by mouth 2 (two) times daily., Disp: 24 tablet, Rfl: 0 .  Calcium Citrate-Vitamin D (CELEBRATE CALCIUM CITRATE PO), Take 1 tablet by mouth daily., Disp: , Rfl:  .  cephALEXin (KEFLEX) 500 MG capsule, Take 1 capsule (500 mg total) by mouth 3 (three) times daily., Disp: 21 capsule, Rfl: 0 .  Cholecalciferol (D-3-5) 5000 units capsule, Take 5,000 Units by mouth daily. CELEBRATE D3 CHEWABLE., Disp: , Rfl:  .  ciprofloxacin (CIPRO) 500 MG tablet, Take by mouth., Disp: , Rfl:  .  ferrous sulfate 325 (65 FE) MG tablet, Take 325 mg by mouth 2 (two) times a week., Disp: , Rfl:  .  HYDROcodone-acetaminophen (NORCO/VICODIN) 5-325 MG tablet, Take 1 tablet by mouth every 6 (six) hours as needed for moderate pain., Disp: 30 tablet, Rfl: 0 .  ibuprofen (ADVIL) 800 MG tablet, Take 1 tablet (800 mg total) by mouth every 6 (six) hours as needed., Disp: 60 tablet, Rfl: 1 .  ibuprofen (ADVIL) 800 MG tablet, Take 1 tablet (800 mg total) by mouth every 6 (six) hours as needed., Disp: 60 tablet, Rfl: 1 .  Liraglutide -Weight Management (SAXENDA) 18 MG/3ML SOPN, Inject into the skin., Disp: , Rfl:  .   lisinopril-hydrochlorothiazide (PRINZIDE,ZESTORETIC) 10-12.5 MG tablet, Take 1 tablet by mouth daily., Disp: , Rfl:  .  Multiple Vitamin (MULTI-VITAMINS) TABS, Take by mouth., Disp: , Rfl:  .  Multiple Vitamins-Minerals (CELEBRATE MULTI-COMPLETE 18) CAPS, Take 1 capsule by mouth daily. CELEBRATE MULTI-ADEK CHEWABLE., Disp: , Rfl:  .  omeprazole (PRILOSEC) 20 MG capsule, Take 20 mg by mouth daily before breakfast. , Disp: , Rfl:  .  phentermine (ADIPEX-P) 37.5 MG tablet, Take by mouth., Disp: , Rfl:  .  promethazine (PHENERGAN) 25 MG tablet, Take 1 tablet (25 mg total) by mouth every 8 (eight) hours as needed for nausea or vomiting., Disp: 20 tablet, Rfl: 0 .  tiZANidine (ZANAFLEX) 4 MG tablet, Take 4 mg by mouth every 8 (eight) hours as needed for muscle spasms., Disp: , Rfl:   Social History   Tobacco Use  Smoking Status Former Smoker  . Quit date: 08/23/2011  . Years since quitting: 9.3  Smokeless Tobacco Never Used    Allergies  Allergen Reactions  . Sulfa Antibiotics Hives and Other (See Comments)    INTOLERANCE >  Severe headache   Objective:  There were no vitals filed for this visit. There is no height or weight on file to calculate BMI. Constitutional Well developed. Well nourished.  Vascular Foot warm and well perfused. Capillary refill normal to all digits.  Neurologic Normal speech. Oriented to person, place, and time. Epicritic sensation to light touch grossly present bilaterally.  Dermatologic Skin healing well without signs of infection. Skin edges well coapted without signs of infection. No pain on palpation to the previous bunion surgical site. Good range of motion noted at the first metatarsophalangeal joint without intra-articular pain. Good range of motion noted at the second metatarsophalangeal joint without intra-articular pain. Good correction alignment noted of the first metatarsal/right and second ray.  Orthopedic: Tenderness to palpation noted about the  surgical site.   Radiographs: None Assessment:   1. Hallux abducto valgus, right   2. Hammertoe of second toe of right foot   3. Status post right foot surgery    Plan:  Patient was evaluated and treated and all questions answered.  S/p foot surgery right -Progressing as expected post-operatively. -XR: None -WB Status: Weightbearing as tolerated in regular shoes -Sutures: None -Medications: None -Foot redressed. -I will see her back again in 6 weeks for final postop check. Overall she has good correction of the toe and the bunion site.  No follow-ups on file.

## 2020-12-31 ENCOUNTER — Ambulatory Visit: Payer: Medicare Other | Admitting: Podiatry

## 2021-01-12 ENCOUNTER — Ambulatory Visit (INDEPENDENT_AMBULATORY_CARE_PROVIDER_SITE_OTHER): Payer: 59 | Admitting: Podiatry

## 2021-01-12 ENCOUNTER — Encounter: Payer: Self-pay | Admitting: Podiatry

## 2021-01-12 ENCOUNTER — Ambulatory Visit (INDEPENDENT_AMBULATORY_CARE_PROVIDER_SITE_OTHER): Payer: 59

## 2021-01-12 ENCOUNTER — Other Ambulatory Visit: Payer: Self-pay

## 2021-01-12 DIAGNOSIS — M2011 Hallux valgus (acquired), right foot: Secondary | ICD-10-CM

## 2021-01-12 DIAGNOSIS — S93401D Sprain of unspecified ligament of right ankle, subsequent encounter: Secondary | ICD-10-CM | POA: Diagnosis not present

## 2021-01-18 ENCOUNTER — Encounter: Payer: Self-pay | Admitting: Podiatry

## 2021-01-18 NOTE — Progress Notes (Signed)
Subjective:  Patient ID: Julie Douglas, female    DOB: 1965-11-02,  MRN: [id number]  Chief Complaint  Patient presents with  . Post-op Problem    PT stated that after she had a pin removal she started having pain in her foot    55 y.o. female presents with the above complaint.  Patient presents with follow-up of right lateral ankle pain.  Patient states it started after pin removal has been very sore and tenderness to the outside.  She does not recall twisting or injuring her foot.  She wants to get this evaluated.  Is pain with ambulation.  Pain with standing as well.  Mild pain with resting.  Pain scale is 6 out of 10 and there is little bit of instability of the ankle joint.  She denies any other acute complaints she has not tried any treatment options for this.   Review of Systems: Negative except as noted in the HPI. Denies N/V/F/Ch.  Past Medical History:  Diagnosis Date  . Arthritis   . GERD (gastroesophageal reflux disease)   . History of kidney stones   . Hypertension     Current Outpatient Medications:  .  acetaminophen (TYLENOL) 500 MG tablet, 1-2 tabs po q6hrs prn pain, Disp: 100 tablet, Rfl: 0 .  apixaban (ELIQUIS) 2.5 MG TABS tablet, Take 1 tablet (2.5 mg total) by mouth 2 (two) times daily., Disp: 24 tablet, Rfl: 0 .  Calcium Citrate-Vitamin D (CELEBRATE CALCIUM CITRATE PO), Take 1 tablet by mouth daily., Disp: , Rfl:  .  cephALEXin (KEFLEX) 500 MG capsule, Take 1 capsule (500 mg total) by mouth 3 (three) times daily., Disp: 21 capsule, Rfl: 0 .  Cholecalciferol (D-3-5) 5000 units capsule, Take 5,000 Units by mouth daily. CELEBRATE D3 CHEWABLE., Disp: , Rfl:  .  ciprofloxacin (CIPRO) 500 MG tablet, Take by mouth., Disp: , Rfl:  .  ferrous sulfate 325 (65 FE) MG tablet, Take 325 mg by mouth 2 (two) times a week., Disp: , Rfl:  .  HYDROcodone-acetaminophen (NORCO/VICODIN) 5-325 MG tablet, Take 1 tablet by mouth every 6 (six) hours as needed for moderate pain., Disp: 30  tablet, Rfl: 0 .  ibuprofen (ADVIL) 800 MG tablet, Take 1 tablet (800 mg total) by mouth every 6 (six) hours as needed., Disp: 60 tablet, Rfl: 1 .  ibuprofen (ADVIL) 800 MG tablet, Take 1 tablet (800 mg total) by mouth every 6 (six) hours as needed., Disp: 60 tablet, Rfl: 1 .  Liraglutide -Weight Management (SAXENDA) 18 MG/3ML SOPN, Inject into the skin., Disp: , Rfl:  .  lisinopril-hydrochlorothiazide (PRINZIDE,ZESTORETIC) 10-12.5 MG tablet, Take 1 tablet by mouth daily., Disp: , Rfl:  .  Multiple Vitamin (MULTI-VITAMINS) TABS, Take by mouth., Disp: , Rfl:  .  Multiple Vitamins-Minerals (CELEBRATE MULTI-COMPLETE 18) CAPS, Take 1 capsule by mouth daily. CELEBRATE MULTI-ADEK CHEWABLE., Disp: , Rfl:  .  omeprazole (PRILOSEC) 20 MG capsule, Take 20 mg by mouth daily before breakfast. , Disp: , Rfl:  .  phentermine (ADIPEX-P) 37.5 MG tablet, Take by mouth., Disp: , Rfl:  .  promethazine (PHENERGAN) 25 MG tablet, Take 1 tablet (25 mg total) by mouth every 8 (eight) hours as needed for nausea or vomiting., Disp: 20 tablet, Rfl: 0 .  tiZANidine (ZANAFLEX) 4 MG tablet, Take 4 mg by mouth every 8 (eight) hours as needed for muscle spasms., Disp: , Rfl:   Social History   Tobacco Use  Smoking Status Former Smoker  . Quit date: 08/23/2011  . Years  since quitting: 9.4  Smokeless Tobacco Never Used    Allergies  Allergen Reactions  . Sulfa Antibiotics Hives and Other (See Comments)    INTOLERANCE >  Severe headache   Objective:  There were no vitals filed for this visit. There is no height or weight on file to calculate BMI. Constitutional Well developed. Well nourished.  Vascular Dorsalis pedis pulses palpable bilaterally. Posterior tibial pulses palpable bilaterally. Capillary refill normal to all digits.  No cyanosis or clubbing noted. Pedal hair growth normal.  Neurologic Normal speech. Oriented to person, place, and time. Epicritic sensation to light touch grossly present bilaterally.   Dermatologic Nails well groomed and normal in appearance. No open wounds. No skin lesions.  Orthopedic:  Pain on palpation to right lateral ankle at the level of the ATFL ligament.  Pain with resisted dorsiflexion eversion of the ankle.  Pain with plantarflexion and inversion of the ankle.  No pain at the Achilles tendon, peroneal tendon, posterior tibial tendon.   Radiographs: 3 views of skeletally mature adult right foot: Hardware is intact.  No sign of loosening or backing out noted.  Good correction alignment noted.  Assessment per do position are within normal limits. Assessment:   1. Mild ankle sprain, right, subsequent encounter    Plan:  Patient was evaluated and treated and all questions answered.  Right lateral ankle sprain/capsulitis -I explained to the patient the etiology of ankle sprain and capsulitis and various treatment options were discussed.  Given the amount of pain she is having I believe she may benefit from a steroid injection however she would like to hold off for now.  Given the instability that she is failing we will try bracing for now.  If there is no improvement we will discuss cam boot immobilization versus steroid injection. -Tri-Lock ankle brace was dispensed  Right hallux abductor valgus status post bunionectomy -Clinically doing well.  She is officially discharged from my care from the surgical standpoint.  No follow-ups on file.

## 2021-02-09 ENCOUNTER — Other Ambulatory Visit: Payer: Self-pay

## 2021-02-09 ENCOUNTER — Ambulatory Visit (INDEPENDENT_AMBULATORY_CARE_PROVIDER_SITE_OTHER): Payer: 59 | Admitting: Podiatry

## 2021-02-09 ENCOUNTER — Encounter: Payer: Self-pay | Admitting: Podiatry

## 2021-02-09 DIAGNOSIS — S93401D Sprain of unspecified ligament of right ankle, subsequent encounter: Secondary | ICD-10-CM | POA: Diagnosis not present

## 2021-02-09 NOTE — Progress Notes (Signed)
Subjective:  Patient ID: Julie Douglas, female    DOB: August 25, 1966,  MRN: [id number]  Chief Complaint  Patient presents with  . Foot Pain    Right foot pain PT stated that she is doing well she has no pain at this time and no concerns     55 y.o. female presents with the above complaint.  Patient presents for follow-up of right lateral ankle sprain/capsulitis.  Patient states that she is doing much better.  She does not have any pain.  She states that the brace has completely healed at all.  She denies any other acute complaints   Review of Systems: Negative except as noted in the HPI. Denies N/V/F/Ch.  Past Medical History:  Diagnosis Date  . Arthritis   . GERD (gastroesophageal reflux disease)   . History of kidney stones   . Hypertension     Current Outpatient Medications:  .  acetaminophen (TYLENOL) 500 MG tablet, 1-2 tabs po q6hrs prn pain, Disp: 100 tablet, Rfl: 0 .  apixaban (ELIQUIS) 2.5 MG TABS tablet, Take 1 tablet (2.5 mg total) by mouth 2 (two) times daily., Disp: 24 tablet, Rfl: 0 .  Calcium Citrate-Vitamin D (CELEBRATE CALCIUM CITRATE PO), Take 1 tablet by mouth daily., Disp: , Rfl:  .  cephALEXin (KEFLEX) 500 MG capsule, Take 1 capsule (500 mg total) by mouth 3 (three) times daily., Disp: 21 capsule, Rfl: 0 .  Cholecalciferol (D-3-5) 5000 units capsule, Take 5,000 Units by mouth daily. CELEBRATE D3 CHEWABLE., Disp: , Rfl:  .  ciprofloxacin (CIPRO) 500 MG tablet, Take by mouth., Disp: , Rfl:  .  ferrous sulfate 325 (65 FE) MG tablet, Take 325 mg by mouth 2 (two) times a week., Disp: , Rfl:  .  HYDROcodone-acetaminophen (NORCO/VICODIN) 5-325 MG tablet, Take 1 tablet by mouth every 6 (six) hours as needed for moderate pain., Disp: 30 tablet, Rfl: 0 .  ibuprofen (ADVIL) 800 MG tablet, Take 1 tablet (800 mg total) by mouth every 6 (six) hours as needed., Disp: 60 tablet, Rfl: 1 .  ibuprofen (ADVIL) 800 MG tablet, Take 1 tablet (800 mg total) by mouth every 6 (six) hours as  needed., Disp: 60 tablet, Rfl: 1 .  Liraglutide -Weight Management (SAXENDA) 18 MG/3ML SOPN, Inject into the skin., Disp: , Rfl:  .  lisinopril-hydrochlorothiazide (PRINZIDE,ZESTORETIC) 10-12.5 MG tablet, Take 1 tablet by mouth daily., Disp: , Rfl:  .  Multiple Vitamin (MULTI-VITAMINS) TABS, Take by mouth., Disp: , Rfl:  .  Multiple Vitamins-Minerals (CELEBRATE MULTI-COMPLETE 18) CAPS, Take 1 capsule by mouth daily. CELEBRATE MULTI-ADEK CHEWABLE., Disp: , Rfl:  .  omeprazole (PRILOSEC) 20 MG capsule, Take 20 mg by mouth daily before breakfast. , Disp: , Rfl:  .  phentermine (ADIPEX-P) 37.5 MG tablet, Take by mouth., Disp: , Rfl:  .  promethazine (PHENERGAN) 25 MG tablet, Take 1 tablet (25 mg total) by mouth every 8 (eight) hours as needed for nausea or vomiting., Disp: 20 tablet, Rfl: 0 .  tiZANidine (ZANAFLEX) 4 MG tablet, Take 4 mg by mouth every 8 (eight) hours as needed for muscle spasms., Disp: , Rfl:   Social History   Tobacco Use  Smoking Status Former Smoker  . Quit date: 08/23/2011  . Years since quitting: 9.4  Smokeless Tobacco Never Used    Allergies  Allergen Reactions  . Sulfa Antibiotics Hives and Other (See Comments)    INTOLERANCE >  Severe headache   Objective:  There were no vitals filed for this visit. There is  no height or weight on file to calculate BMI. Constitutional Well developed. Well nourished.  Vascular Dorsalis pedis pulses palpable bilaterally. Posterior tibial pulses palpable bilaterally. Capillary refill normal to all digits.  No cyanosis or clubbing noted. Pedal hair growth normal.  Neurologic Normal speech. Oriented to person, place, and time. Epicritic sensation to light touch grossly present bilaterally.  Dermatologic Nails well groomed and normal in appearance. No open wounds. No skin lesions.  Orthopedic:  No pain on palpation to right lateral ankle at the level of the ATFL ligament.  No pain with resisted dorsiflexion eversion of the  ankle.  No pain with plantarflexion and inversion of the ankle.  No pain at the Achilles tendon, peroneal tendon, posterior tibial tendon.   Radiographs: 3 views of skeletally mature adult right foot: Hardware is intact.  No sign of loosening or backing out noted.  Good correction alignment noted.  Assessment per do position are within normal limits. Assessment:   1. Mild ankle sprain, right, subsequent encounter    Plan:  Patient was evaluated and treated and all questions answered.  Right lateral ankle sprain/capsulitis -Clinically healed with a Tri-Lock ankle brace.  At this time given that she no longer has any pain on the lateral aspect of the foot I have asked her to transition to regular shoes.  If her pain recurs place her self immediately in the brace and allow it to kind of self healed.  Patient states understanding if any foot and ankle issues arise in the future come back and see me.  She states understanding.  Right hallux abductor valgus status post bunionectomy -Clinically doing well.  She is officially discharged from my care from the surgical standpoint.  No follow-ups on file.

## 2021-03-09 ENCOUNTER — Ambulatory Visit (INDEPENDENT_AMBULATORY_CARE_PROVIDER_SITE_OTHER): Payer: 59 | Admitting: Podiatry

## 2021-03-09 ENCOUNTER — Encounter: Payer: Self-pay | Admitting: Podiatry

## 2021-03-09 ENCOUNTER — Other Ambulatory Visit: Payer: Self-pay

## 2021-03-09 DIAGNOSIS — D689 Coagulation defect, unspecified: Secondary | ICD-10-CM | POA: Diagnosis not present

## 2021-03-09 DIAGNOSIS — L84 Corns and callosities: Secondary | ICD-10-CM | POA: Insufficient documentation

## 2021-03-09 NOTE — Progress Notes (Signed)
This patient presents the office with chief complaint of a painful callus on the bottom of her big toe left foot.  She says she is scheduled for a graduation tomorrow and does not want to have pain in her toe.  She says she has had this callus worsening over time and she presents the office today for  evaluation and treatment of her foot and her callus left big toe.  Patient states that she has had surgery performed by Dr. Allena Katz and is healing .  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    Vascular  Dorsalis pedis and posterior tibial pulses are palpable  B/L.  Capillary return  WNL.  Temperature gradient is  WNL.  Skin turgor  WNL  Sensorium  Senn Weinstein monofilament wire  WNL. Normal tactile sensation.  Nail Exam  Patient has normal nails with no evidence of bacterial or fungal infection.  Orthopedic  Exam  Muscle tone and muscle strength  WNL.  No limitations of motion feet  B/L.  No crepitus or joint effusion noted.  Foot type is unremarkable and digits show no abnormalities.  HAV 1st MPJ left foot.  Dorsiflexion noted at the IPJ left hallux.  Skin  No open lesions.  Normal skin texture and turgor.  Callus under IPJ left hallux.  Callus left hallux.    Debride callus with # 15 blade.  Padding added to left hallux.  RTC   Prn    Helane Gunther DPM

## 2021-03-30 ENCOUNTER — Ambulatory Visit: Payer: 59 | Admitting: Podiatry

## 2021-04-08 ENCOUNTER — Other Ambulatory Visit: Payer: Self-pay

## 2021-04-08 ENCOUNTER — Ambulatory Visit (INDEPENDENT_AMBULATORY_CARE_PROVIDER_SITE_OTHER): Payer: 59

## 2021-04-08 ENCOUNTER — Ambulatory Visit (INDEPENDENT_AMBULATORY_CARE_PROVIDER_SITE_OTHER): Payer: 59 | Admitting: Podiatry

## 2021-04-08 DIAGNOSIS — Z01818 Encounter for other preprocedural examination: Secondary | ICD-10-CM | POA: Diagnosis not present

## 2021-04-08 DIAGNOSIS — M24575 Contracture, left foot: Secondary | ICD-10-CM | POA: Diagnosis not present

## 2021-04-08 DIAGNOSIS — M2012 Hallux valgus (acquired), left foot: Secondary | ICD-10-CM | POA: Diagnosis not present

## 2021-04-08 DIAGNOSIS — M2042 Other hammer toe(s) (acquired), left foot: Secondary | ICD-10-CM

## 2021-04-13 ENCOUNTER — Encounter: Payer: Self-pay | Admitting: Podiatry

## 2021-04-13 NOTE — Progress Notes (Signed)
Subjective:  Patient ID: Julie Douglas, female    DOB: [birth year],  MRN: [id number]  Chief Complaint  Patient presents with   Foot Pain    PT stated that she is having some pain in her foot     55 y.o. female presents with the above complaint.  Patient presents with a complaint of left bunion deformity with pain to the medial eminence.  She had bunion correction done to the right side for which she is very happy and is corrected well.  Now she would like to get the left side done.  She has failed all conservative treatment option to the left side.  She denies any other acute complaints.  She would like to get the left side treated.  Pain scale 7 out of 10 dull achy in nature hurts with shoe and ambulation  Review of Systems: Negative except as noted in the HPI. Denies N/V/F/Ch.  Past Medical History:  Diagnosis Date   Arthritis    GERD (gastroesophageal reflux disease)    History of kidney stones    Hypertension     Current Outpatient Medications:    acetaminophen (TYLENOL) 500 MG tablet, 1-2 tabs po q6hrs prn pain, Disp: 100 tablet, Rfl: 0   apixaban (ELIQUIS) 2.5 MG TABS tablet, Take 1 tablet (2.5 mg total) by mouth 2 (two) times daily., Disp: 24 tablet, Rfl: 0   Calcium Citrate-Vitamin D (CELEBRATE CALCIUM CITRATE PO), Take 1 tablet by mouth daily., Disp: , Rfl:    cephALEXin (KEFLEX) 500 MG capsule, Take 1 capsule (500 mg total) by mouth 3 (three) times daily., Disp: 21 capsule, Rfl: 0   Cholecalciferol (D-3-5) 5000 units capsule, Take 5,000 Units by mouth daily. CELEBRATE D3 CHEWABLE., Disp: , Rfl:    ciprofloxacin (CIPRO) 500 MG tablet, Take by mouth., Disp: , Rfl:    ferrous sulfate 325 (65 FE) MG tablet, Take 325 mg by mouth 2 (two) times a week., Disp: , Rfl:    HYDROcodone-acetaminophen (NORCO/VICODIN) 5-325 MG tablet, Take 1 tablet by mouth every 6 (six) hours as needed for moderate pain., Disp: 30 tablet, Rfl: 0   ibuprofen (ADVIL) 800 MG tablet, Take 1 tablet (800 mg total)  by mouth every 6 (six) hours as needed., Disp: 60 tablet, Rfl: 1   ibuprofen (ADVIL) 800 MG tablet, Take 1 tablet (800 mg total) by mouth every 6 (six) hours as needed., Disp: 60 tablet, Rfl: 1   Liraglutide -Weight Management (SAXENDA) 18 MG/3ML SOPN, Inject into the skin., Disp: , Rfl:    lisinopril-hydrochlorothiazide (PRINZIDE,ZESTORETIC) 10-12.5 MG tablet, Take 1 tablet by mouth daily., Disp: , Rfl:    Multiple Vitamin (MULTI-VITAMINS) TABS, Take by mouth., Disp: , Rfl:    Multiple Vitamins-Minerals (CELEBRATE MULTI-COMPLETE 18) CAPS, Take 1 capsule by mouth daily. CELEBRATE MULTI-ADEK CHEWABLE., Disp: , Rfl:    omeprazole (PRILOSEC) 20 MG capsule, Take 20 mg by mouth daily before breakfast. , Disp: , Rfl:    phentermine (ADIPEX-P) 37.5 MG tablet, Take by mouth., Disp: , Rfl:    promethazine (PHENERGAN) 25 MG tablet, Take 1 tablet (25 mg total) by mouth every 8 (eight) hours as needed for nausea or vomiting., Disp: 20 tablet, Rfl: 0   tiZANidine (ZANAFLEX) 4 MG tablet, Take 4 mg by mouth every 8 (eight) hours as needed for muscle spasms., Disp: , Rfl:   Social History   Tobacco Use  Smoking Status Former   Pack years: 0.00   Types: Cigarettes   Quit date: 08/23/2011   Years  since quitting: 9.6  Smokeless Tobacco Never    Allergies  Allergen Reactions   Sulfa Antibiotics Hives and Other (See Comments)    INTOLERANCE >  Severe headache   Objective:  There were no vitals filed for this visit. There is no height or weight on file to calculate BMI. Constitutional Well developed. Well nourished.  Vascular Dorsalis pedis pulses palpable bilaterally. Posterior tibial pulses palpable bilaterally. Capillary refill normal to all digits.  No cyanosis or clubbing noted. Pedal hair growth normal.  Neurologic Normal speech. Oriented to person, place, and time. Epicritic sensation to light touch grossly present bilaterally.  Dermatologic Nails well groomed and normal in appearance. No  open wounds. No skin lesions.  Orthopedic: Normal joint ROM without pain or crepitus bilaterally. Hallux abductovalgus deformity present.  This is a track bound not a tracking deformity.  No intra-articular first MPJ pain noted. Left 1st MPJ diminished range of motion. Left 1st TMT without gross hypermobility. Right 1st MPJ diminished range of motion  Right 1st TMT without gross hypermobility. Lesser digital contractures present left.  Left second digit hammertoe contracture with metatarsophalangeal joint contracture noted.  Pain on palpation to the hammertoe   Radiographs: Taken and reviewed. Hallux abductovalgus deformity present. Metatarsal parabola  with first metatarsal with a shorter than second metatarsal. . 1st/2nd IMA: Moderate intermetatarsal angle; TSP: 5 out of 7.  Assessment:   1. Preoperative examination   2. Hallux abducto valgus, left   3. Hammertoe of second toe of left foot   4. Joint contracture of foot, left    Plan:  Patient was evaluated and treated and all questions answered.  Hallux abductovalgus deformity, left with underlying second digit hammertoe with metatarsophalangeal joint contracture of second with plantarflexed metatarsal of the second -XR as above. -Patient has failed all conservative therapy and wishes to proceed with surgical intervention. All risks, benefits, and alternatives discussed with patient. No guarantees given. Consent reviewed and signed by patient. Post-op course explained at length. -Planned procedures: Left chevron osteotomy with with Akin osteotomy with metatarsal phalangeal joint capsulotomy with Weil osteotomy of the second with hammertoe correction of the second with fixation of -Risk factors: None -I discussed with him a preoperative postoperative intraoperative plan in extensive detail.  I discussed with her that we will plan on doing the same procedure as it did to the right side.  She states understanding like to proceed with  that. -Informed surgical risk consent was reviewed and read aloud to the patient.  I reviewed the films.  I have discussed my findings with the patient in great detail.  I have discussed all risks including but not limited to infection, stiffness, scarring, limp, disability, deformity, damage to blood vessels and nerves, numbness, poor healing, need for braces, arthritis, chronic pain, amputation, death.  All benefits and realistic expectations discussed in great detail.  I have made no promises as to the outcome.  I have provided realistic expectations.  I have offered the patient a 2nd opinion, which they have declined and assured me they preferred to proceed despite the risks   No follow-ups on file.

## 2021-05-27 ENCOUNTER — Ambulatory Visit: Payer: 59 | Admitting: Podiatry

## 2021-06-01 ENCOUNTER — Other Ambulatory Visit: Payer: Self-pay

## 2021-06-01 ENCOUNTER — Ambulatory Visit (INDEPENDENT_AMBULATORY_CARE_PROVIDER_SITE_OTHER): Payer: 59 | Admitting: Podiatry

## 2021-06-01 DIAGNOSIS — M2012 Hallux valgus (acquired), left foot: Secondary | ICD-10-CM

## 2021-06-01 DIAGNOSIS — M2042 Other hammer toe(s) (acquired), left foot: Secondary | ICD-10-CM

## 2021-06-01 DIAGNOSIS — M24575 Contracture, left foot: Secondary | ICD-10-CM

## 2021-06-01 DIAGNOSIS — Z01818 Encounter for other preprocedural examination: Secondary | ICD-10-CM

## 2021-06-03 NOTE — Progress Notes (Signed)
Patient is here to reschedule her surgery.  I have asked her to stop by the surgery scheduler to reschedule the case.  At this time this is not an office visit.  Patient states understanding

## 2021-07-06 ENCOUNTER — Encounter: Payer: 59 | Admitting: Podiatry

## 2021-07-20 ENCOUNTER — Encounter: Payer: 59 | Admitting: Podiatry

## 2021-09-16 ENCOUNTER — Ambulatory Visit: Payer: 59 | Admitting: Podiatry

## 2021-09-21 ENCOUNTER — Ambulatory Visit: Payer: Medicaid Other | Admitting: Podiatry

## 2022-02-14 ENCOUNTER — Encounter (HOSPITAL_COMMUNITY): Payer: Self-pay | Admitting: Emergency Medicine

## 2022-02-14 ENCOUNTER — Emergency Department (HOSPITAL_COMMUNITY): Payer: Medicare Other

## 2022-02-14 ENCOUNTER — Emergency Department (HOSPITAL_COMMUNITY)
Admission: EM | Admit: 2022-02-14 | Discharge: 2022-02-14 | Disposition: A | Discharge status: Home / Self Care | Payer: Medicare Other | Attending: Emergency Medicine | Admitting: Emergency Medicine

## 2022-02-14 DIAGNOSIS — S81811A Laceration without foreign body, right lower leg, initial encounter: Secondary | ICD-10-CM

## 2022-02-14 DIAGNOSIS — W25XXXA Contact with sharp glass, initial encounter: Secondary | ICD-10-CM | POA: Diagnosis not present

## 2022-02-14 DIAGNOSIS — Z23 Encounter for immunization: Secondary | ICD-10-CM | POA: Insufficient documentation

## 2022-02-14 DIAGNOSIS — S8991XA Unspecified injury of right lower leg, initial encounter: Secondary | ICD-10-CM | POA: Diagnosis present

## 2022-02-14 DIAGNOSIS — Z7901 Long term (current) use of anticoagulants: Secondary | ICD-10-CM | POA: Insufficient documentation

## 2022-02-14 DIAGNOSIS — Y92008 Other place in unspecified non-institutional (private) residence as the place of occurrence of the external cause: Secondary | ICD-10-CM | POA: Insufficient documentation

## 2022-02-14 MED ORDER — TETANUS-DIPHTH-ACELL PERTUSSIS 5-2.5-18.5 LF-MCG/0.5 IM SUSY
0.5000 mL | PREFILLED_SYRINGE | Freq: Once | INTRAMUSCULAR | Status: AC
  Administered 2022-02-14: 0.5 mL via INTRAMUSCULAR
  Filled 2022-02-14: qty 0.5

## 2022-02-14 MED ORDER — LIDOCAINE-EPINEPHRINE 2 %-1:100000 IJ SOLN
1.7000 mL | Freq: Once | INTRAMUSCULAR | Status: AC
  Administered 2022-02-14: 1.7 mL via INTRADERMAL
  Filled 2022-02-14: qty 1

## 2022-02-14 NOTE — Discharge Instructions (Signed)
Keep area clean by washing with soap and water daily. Do not submerge in water or scrub stitches Apply a bandage at least once daily, change more often if it is dirty Watch for signs of infection (redness, drainage, worsening pain) Take Tylenol or Ibuprofen for pain as needed Have stitches removed in 7-10 days  

## 2022-02-14 NOTE — ED Notes (Signed)
I provided reinforced discharge education based off of discharge instructions. Pt acknowledged and understood my education. Pt had no further questions/concerns for provider/myself.  °

## 2022-02-14 NOTE — ED Triage Notes (Signed)
Per patient, states a glass fell on the floor cutting her right lower extremity-bleeding controled at this time-states it happened around 9:30-came to ED due to uncontrolled bleeding ?

## 2022-02-14 NOTE — ED Provider Notes (Signed)
?[facility]-EMERGENCY DEPT ?Provider Note ? ? ?CSN: [id number] ?Arrival date & time: 02/14/22  1122 ? ?  ? ?History ? ?Chief Complaint  ?Patient presents with  ? Extremity Laceration  ? ? ?Julie Douglas is a 56 y.o. female. ? ?HPI ?56 year old female presented to the ER after accidentally breaking a glass table in her living room and having cut her right lower leg on the glass.  However 9:30 AM this morning.  She presented to the ER due to unable to control the bleeding.  She is unclear when her last tetanus shot was.  Has Xarelto listed on her problem list however is adamant that she does not take any blood thinners. ?  ? ?Home Medications ?Prior to Admission medications   ?Medication Sig Start Date End Date Taking? Authorizing Provider  ?acetaminophen (TYLENOL) 500 MG tablet 1-2 tabs po q6hrs prn pain 11/30/17   Chadwell, Ivin Booty, PA-C  ?apixaban (ELIQUIS) 2.5 MG TABS tablet Take 1 tablet (2.5 mg total) by mouth 2 (two) times daily. 11/30/17   Chadwell, Ivin Booty, PA-C  ?Calcium Citrate-Vitamin D (CELEBRATE CALCIUM CITRATE PO) Take 1 tablet by mouth daily.    [provider]  ?cephALEXin (KEFLEX) 500 MG capsule Take 1 capsule (500 mg total) by mouth 3 (three) times daily. 06/19/18   Park Liter, DPM  ?Cholecalciferol (D-3-5) 5000 units capsule Take 5,000 Units by mouth daily. CELEBRATE D3 CHEWABLE.    [provider]  ?ciprofloxacin (CIPRO) 500 MG tablet Take by mouth. 10/06/16   [provider]  ?ferrous sulfate 325 (65 FE) MG tablet Take 325 mg by mouth 2 (two) times a week.    [provider]  ?HYDROcodone-acetaminophen (NORCO/VICODIN) 5-325 MG tablet Take 1 tablet by mouth every 6 (six) hours as needed for moderate pain. 06/28/18   Lenn Sink, DPM  ?ibuprofen (ADVIL) 800 MG tablet Take 1 tablet (800 mg total) by mouth every 6 (six) hours as needed. 11/22/20   Candelaria Stagers, DPM  ?ibuprofen (ADVIL) 800 MG tablet Take 1 tablet (800 mg total) by mouth every 6  (six) hours as needed. 11/23/20   Candelaria Stagers, DPM  ?Liraglutide -Weight Management (SAXENDA) 18 MG/3ML SOPN Inject into the skin. 02/23/20   [provider]  ?lisinopril-hydrochlorothiazide (PRINZIDE,ZESTORETIC) 10-12.5 MG tablet Take 1 tablet by mouth daily.    [provider]  ?Multiple Vitamin (MULTI-VITAMINS) TABS Take by mouth.    [provider]  ?Multiple Vitamins-Minerals (CELEBRATE MULTI-COMPLETE 18) CAPS Take 1 capsule by mouth daily. CELEBRATE MULTI-ADEK CHEWABLE.    [provider]  ?omeprazole (PRILOSEC) 20 MG capsule Take 20 mg by mouth daily before breakfast.     [provider]  ?phentermine (ADIPEX-P) 37.5 MG tablet Take by mouth. 04/29/18   [provider]  ?promethazine (PHENERGAN) 25 MG tablet Take 1 tablet (25 mg total) by mouth every 8 (eight) hours as needed for nausea or vomiting. 06/19/18   Park Liter, DPM  ?tiZANidine (ZANAFLEX) 4 MG tablet Take 4 mg by mouth every 8 (eight) hours as needed for muscle spasms.    [provider]  ?   ? ?Allergies    ?Sulfa antibiotics   ? ?Review of Systems   ?Review of Systems ?Ten systems reviewed and are negative for acute change, except as noted in the HPI.  ? ?Physical Exam ?Updated Vital Signs ?BP (!) 133/98 (BP Location: Left Arm)   Pulse 78   Temp 98.1 ?F (36.7 ?C) (Oral)  Resp 18   SpO2 100%  ?Physical Exam ?Vitals and nursing note reviewed.  ?Constitutional:   ?   General: She is not in acute distress. ?   Appearance: She is well-developed.  ?HENT:  ?   Head: Normocephalic and atraumatic.  ?Eyes:  ?   Conjunctiva/sclera: Conjunctivae normal.  ?Cardiovascular:  ?   Rate and Rhythm: Normal rate and regular rhythm.  ?   Heart sounds: No murmur heard. ?Pulmonary:  ?   Effort: Pulmonary effort is normal. No respiratory distress.  ?   Breath sounds: Normal breath sounds.  ?Abdominal:  ?   Palpations: Abdomen is soft.  ?   Tenderness: There is no abdominal tenderness.   ?Musculoskeletal:     ?   General: No swelling.  ?   Cervical back: Neck supple.  ?Skin: ?   General: Skin is warm and dry.  ?   Capillary Refill: Capillary refill takes less than 2 seconds.  ?   Comments: 4-5 cm linear laceration to the right medial frontal aspect of her right lower leg.  Mild blood oozing, no pulsatile bleeding.  No visible foreign objects.  ?Neurological:  ?   Mental Status: She is alert.  ?Psychiatric:     ?   Mood and Affect: Mood normal.  ? ? ?ED Results / Procedures / Treatments   ?Labs ?(all labs ordered are listed, but only abnormal results are displayed) ?Labs Reviewed - No data to display ? ?EKG ?None ? ?Radiology ?DG Tibia/Fibula Right ? ?Result Date: 02/14/2022 ?CLINICAL DATA:  Trauma, skin laceration caused by glass shard EXAM: RIGHT TIBIA AND FIBULA - 2 VIEW COMPARISON:  None. FINDINGS: No recent fracture or dislocation is seen. There are no opaque foreign bodies. 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 Plantar spur is seen in calcaneus. IMPRESSION: No recent fracture is seen in the right tibia and fibula. There are no opaque foreign bodies. Electronically Signed   By: Ernie AvenaPalani  Rathinasamy M.D.   On: 02/14/2022 13:52   ? ?Procedures ?Marland Kitchen..Laceration Repair ? ?Date/Time: 02/14/2022 2:13 PM ?Performed by: Mare FerrariBelaya, Ema Hebner A, PA-C ?Authorized by: Mare FerrariBelaya, Izzy Doubek A, PA-C  ? ?Consent:  ?  Consent obtained:  Verbal ?  Consent given by:  Patient ?  Risks discussed:  Infection, need for additional repair, pain, poor cosmetic result and poor wound healing ?  Alternatives discussed:  No treatment and delayed treatment ?Universal protocol:  ?  Procedure explained and questions answered to patient or proxy's satisfaction: yes   ?  Relevant documents present and verified: yes   ?  Test results available: yes   ?  Imaging studies available: yes   ?  Required blood products, implants, devices, and special equipment available: yes   ?  Site/side marked: yes   ?  Immediately prior to procedure, a time out  was called: yes   ?  Patient identity confirmed:  Verbally with patient ?Anesthesia:  ?  Anesthesia method:  Local infiltration ?  Local anesthetic:  Lidocaine 2% WITH epi ?Laceration details:  ?  Location:  Leg ?  Length (cm):  4 ?  Depth (mm):  0.5 ?Pre-procedure details:  ?  Preparation:  Patient was prepped and draped in usual sterile fashion ?Exploration:  ?  Hemostasis achieved with:  Epinephrine and direct pressure ?  Imaging obtained: x-ray   ?  Imaging outcome: foreign body not noted   ?  Wound exploration: wound explored through full range of motion   ?  Wound extent: no  muscle damage noted, no nerve damage noted and no tendon damage noted   ?  Contaminated: no   ?Treatment:  ?  Area cleansed with:  Shur-Clens ?  Amount of cleaning:  Extensive ?  Irrigation solution:  Sterile saline ?Skin repair:  ?  Repair method:  Sutures ?  Suture size:  5-0 ?  Suture material:  Prolene ?  Number of sutures:  6 ?Approximation:  ?  Approximation:  Close ?Post-procedure details:  ?  Dressing:  Non-adherent dressing ?  Procedure completion:  Tolerated well, no immediate complications  ? ? ?Medications Ordered in ED ?Medications  ?lidocaine-EPINEPHrine (XYLOCAINE W/EPI) 2 %-1:100000 (with pres) injection 1.7 mL (1.7 mLs Intradermal Given by Other 02/14/22 1357)  ?Tdap (BOOSTRIX) injection 0.5 mL (0.5 mLs Intramuscular Given 02/14/22 1309)  ? ? ?ED Course/ Medical Decision Making/ A&P ?Clinical Course as of 02/14/22 1414  ?Tue Feb 14, 2022  ?1331 DG Tibia/Fibula Right [CN]  ?  ?Clinical Course User Index ?[CN] Vallery Ridge M, [place]  ? ?                        ?Medical Decision Making ?Amount and/or Complexity of Data Reviewed ?Radiology: ordered. ? ? ?Pressure irrigation performed. Wound explored and base of wound visualized in a bloodless field without evidence of foreign body.  Laceration occurred < 8 hours prior to repair which was well tolerated. Tdap updated.  Pt has no comorbidities to effect normal wound  healing.  Plain films with no evidence of fractures or retained foreign bodies.  Pt discharged without antibiotics.  Discussed suture home care with patient and answered questions. Pt to follow-up for wound check and suture remov

## 2022-05-29 ENCOUNTER — Other Ambulatory Visit (HOSPITAL_BASED_OUTPATIENT_CLINIC_OR_DEPARTMENT_OTHER): Payer: Self-pay | Admitting: Family Medicine

## 2022-05-29 DIAGNOSIS — Z780 Asymptomatic menopausal state: Secondary | ICD-10-CM

## 2022-06-23 ENCOUNTER — Ambulatory Visit (INDEPENDENT_AMBULATORY_CARE_PROVIDER_SITE_OTHER): Payer: Medicare Other | Admitting: Podiatry

## 2022-06-23 DIAGNOSIS — B351 Tinea unguium: Secondary | ICD-10-CM

## 2022-06-23 MED ORDER — CICLOPIROX 8 % EX SOLN: Freq: Every day | CUTANEOUS | 0 refills | Status: AC

## 2022-06-23 NOTE — Progress Notes (Signed)
Subjective:  Patient ID: Julie Douglas, female    DOB: [birth year],  MRN: [id number]  Chief Complaint  Patient presents with   Callouses    56 y.o. female presents with the above complaint.  Patient presents with bilateral second digit onychomycosis thickened elongated dystrophic nails x2.  Patient like to discuss treatment options for her.  She does not want to laser.  She wanted to get it evaluated.  She has not seen MRIs prior to seeing me.  Is 1 out of 10 pain scale is to thickening that she is bothering her   Review of Systems: Negative except as noted in the HPI. Denies N/V/F/Ch.  Past Medical History:  Diagnosis Date   Arthritis    GERD (gastroesophageal reflux disease)    History of kidney stones    Hypertension     Current Outpatient Medications:    ciclopirox (PENLAC) 8 % solution, Apply topically at bedtime. Apply over nail and surrounding skin. Apply daily over previous coat. After seven (7) days, may remove with alcohol and continue cycle., Disp: 6.6 mL, Rfl: 0   acetaminophen (TYLENOL) 500 MG tablet, 1-2 tabs po q6hrs prn pain, Disp: 100 tablet, Rfl: 0   apixaban (ELIQUIS) 2.5 MG TABS tablet, Take 1 tablet (2.5 mg total) by mouth 2 (two) times daily., Disp: 24 tablet, Rfl: 0   Calcium Citrate-Vitamin D (CELEBRATE CALCIUM CITRATE PO), Take 1 tablet by mouth daily., Disp: , Rfl:    cephALEXin (KEFLEX) 500 MG capsule, Take 1 capsule (500 mg total) by mouth 3 (three) times daily., Disp: 21 capsule, Rfl: 0   Cholecalciferol (D-3-5) 5000 units capsule, Take 5,000 Units by mouth daily. CELEBRATE D3 CHEWABLE., Disp: , Rfl:    ciprofloxacin (CIPRO) 500 MG tablet, Take by mouth., Disp: , Rfl:    ferrous sulfate 325 (65 FE) MG tablet, Take 325 mg by mouth 2 (two) times a week., Disp: , Rfl:    HYDROcodone-acetaminophen (NORCO/VICODIN) 5-325 MG tablet, Take 1 tablet by mouth every 6 (six) hours as needed for moderate pain., Disp: 30 tablet, Rfl: 0   ibuprofen (ADVIL) 800 MG tablet,  Take 1 tablet (800 mg total) by mouth every 6 (six) hours as needed., Disp: 60 tablet, Rfl: 1   ibuprofen (ADVIL) 800 MG tablet, Take 1 tablet (800 mg total) by mouth every 6 (six) hours as needed., Disp: 60 tablet, Rfl: 1   Liraglutide -Weight Management (SAXENDA) 18 MG/3ML SOPN, Inject into the skin., Disp: , Rfl:    lisinopril-hydrochlorothiazide (PRINZIDE,ZESTORETIC) 10-12.5 MG tablet, Take 1 tablet by mouth daily., Disp: , Rfl:    Multiple Vitamin (MULTI-VITAMINS) TABS, Take by mouth., Disp: , Rfl:    Multiple Vitamins-Minerals (CELEBRATE MULTI-COMPLETE 18) CAPS, Take 1 capsule by mouth daily. CELEBRATE MULTI-ADEK CHEWABLE., Disp: , Rfl:    omeprazole (PRILOSEC) 20 MG capsule, Take 20 mg by mouth daily before breakfast. , Disp: , Rfl:    phentermine (ADIPEX-P) 37.5 MG tablet, Take by mouth., Disp: , Rfl:    promethazine (PHENERGAN) 25 MG tablet, Take 1 tablet (25 mg total) by mouth every 8 (eight) hours as needed for nausea or vomiting., Disp: 20 tablet, Rfl: 0   tiZANidine (ZANAFLEX) 4 MG tablet, Take 4 mg by mouth every 8 (eight) hours as needed for muscle spasms., Disp: , Rfl:   Social History   Tobacco Use  Smoking Status Former   Types: Cigarettes   Quit date: 08/23/2011   Years since quitting: 10.8  Smokeless Tobacco Never    Allergies  Allergen Reactions   Sulfa Antibiotics Hives and Other (See Comments)    INTOLERANCE >  Severe headache   Objective:  There were no vitals filed for this visit. There is no height or weight on file to calculate BMI. Constitutional Well developed. Well nourished.  Vascular Dorsalis pedis pulses palpable bilaterally. Posterior tibial pulses palpable bilaterally. Capillary refill normal to all digits.  No cyanosis or clubbing noted. Pedal hair growth normal.  Neurologic Normal speech. Oriented to person, place, and time. Epicritic sensation to light touch grossly present bilaterally.  Dermatologic Nails thickened elongated dystrophic  mycotic toenails x2 bilateral hallux nail pain on palpation Skin within normal limits  Orthopedic: Normal joint ROM without pain or crepitus bilaterally. No visible deformities. No bony tenderness.   Radiographs: None Assessment:   1. Onychomycosis due to dermatophyte   2. Nail fungus    Plan:  Patient was evaluated and treated and all questions answered.  Bilateral second digit onychomycosis -Educated the patient on the etiology of onychomycosis and various treatment options associated with improving the fungal load.  I explained to the patient that there is 3 treatment options available to treat the onychomycosis including topical, p.o., laser treatment.  She elected to undergo topical application with Penlac.  I advised her to apply twice a day.  She states understanding   No follow-ups on file.

## 2023-03-02 ENCOUNTER — Emergency Department (HOSPITAL_COMMUNITY): Payer: 59

## 2023-03-02 ENCOUNTER — Other Ambulatory Visit: Payer: Self-pay

## 2023-03-02 ENCOUNTER — Emergency Department (HOSPITAL_COMMUNITY)
Admission: EM | Admit: 2023-03-02 | Discharge: 2023-03-02 | Disposition: A | Discharge status: Home / Self Care | Payer: 59 | Attending: Emergency Medicine | Admitting: Emergency Medicine

## 2023-03-02 ENCOUNTER — Encounter (HOSPITAL_COMMUNITY): Payer: Self-pay

## 2023-03-02 DIAGNOSIS — Z7901 Long term (current) use of anticoagulants: Secondary | ICD-10-CM | POA: Diagnosis not present

## 2023-03-02 DIAGNOSIS — N23 Unspecified renal colic: Secondary | ICD-10-CM | POA: Diagnosis not present

## 2023-03-02 DIAGNOSIS — R109 Unspecified abdominal pain: Secondary | ICD-10-CM | POA: Diagnosis present

## 2023-03-02 LAB — CBC WITH DIFFERENTIAL/PLATELET
Abs Immature Granulocytes: 0.03 10*3/uL (ref 0.00–0.07)
Basophils Absolute: 0 10*3/uL (ref 0.0–0.1)
Basophils Relative: 0 %
Eosinophils Absolute: 0.1 10*3/uL (ref 0.0–0.5)
Eosinophils Relative: 1 %
HCT: 37.8 % (ref 36.0–46.0)
Hemoglobin: 12.5 g/dL (ref 12.0–15.0)
Immature Granulocytes: 0 %
Lymphocytes Relative: 24 %
Lymphs Abs: 2 10*3/uL (ref 0.7–4.0)
MCH: 28.2 pg (ref 26.0–34.0)
MCHC: 33.1 g/dL (ref 30.0–36.0)
MCV: 85.3 fL (ref 80.0–100.0)
Monocytes Absolute: 0.7 10*3/uL (ref 0.1–1.0)
Monocytes Relative: 9 %
Neutro Abs: 5.3 10*3/uL (ref 1.7–7.7)
Neutrophils Relative %: 66 %
Platelets: 182 10*3/uL (ref 150–400)
RBC: 4.43 MIL/uL (ref 3.87–5.11)
RDW: 14.2 % (ref 11.5–15.5)
WBC: 8.1 10*3/uL (ref 4.0–10.5)
nRBC: 0 % (ref 0.0–0.2)

## 2023-03-02 LAB — BASIC METABOLIC PANEL
Anion gap: 10 (ref 5–15)
BUN: 16 mg/dL (ref 6–20)
CO2: 23 mmol/L (ref 22–32)
Calcium: 7.9 mg/dL — ABNORMAL LOW (ref 8.9–10.3)
Chloride: 107 mmol/L (ref 98–111)
Creatinine, Ser: 0.97 mg/dL (ref 0.44–1.00)
GFR, Estimated: >60 mL/min (ref >60)
Glucose, Bld: 96 mg/dL (ref 70–99)
Potassium: 3.2 mmol/L — ABNORMAL LOW (ref 3.5–5.1)
Sodium: 140 mmol/L (ref 135–145)

## 2023-03-02 MED ORDER — HYDROMORPHONE HCL 1 MG/ML IJ SOLN
1.0000 mg | INTRAMUSCULAR | Status: DC | PRN
  Administered 2023-03-02: 1 mg via INTRAVENOUS
  Filled 2023-03-02: qty 1

## 2023-03-02 MED ORDER — OXYCODONE-ACETAMINOPHEN 5-325 MG PO TABS: 1.0000 | ORAL_TABLET | Freq: Three times a day (TID) | ORAL | 0 refills | Status: AC | PRN

## 2023-03-02 MED ORDER — ONDANSETRON HCL 4 MG/2ML IJ SOLN
4.0000 mg | Freq: Four times a day (QID) | INTRAMUSCULAR | Status: DC | PRN
  Administered 2023-03-02: 4 mg via INTRAVENOUS
  Filled 2023-03-02: qty 2

## 2023-03-02 MED ORDER — IBUPROFEN 600 MG PO TABS: 600.0000 mg | ORAL_TABLET | Freq: Three times a day (TID) | ORAL | 0 refills | Status: AC

## 2023-03-02 MED ORDER — IBUPROFEN 600 MG PO TABS: 600.0000 mg | ORAL_TABLET | Freq: Three times a day (TID) | ORAL | 0 refills | Status: DC

## 2023-03-02 MED ORDER — OXYCODONE-ACETAMINOPHEN 5-325 MG PO TABS: 1.0000 | ORAL_TABLET | Freq: Three times a day (TID) | ORAL | 0 refills | Status: DC | PRN

## 2023-03-02 MED ORDER — ACETAMINOPHEN 500 MG PO TABS: 500.0000 mg | ORAL_TABLET | Freq: Four times a day (QID) | ORAL | 0 refills | Status: AC | PRN

## 2023-03-02 MED ORDER — KETOROLAC TROMETHAMINE 30 MG/ML IJ SOLN
15.0000 mg | Freq: Once | INTRAMUSCULAR | Status: AC
  Administered 2023-03-02: 15 mg via INTRAVENOUS
  Filled 2023-03-02: qty 1

## 2023-03-02 MED ORDER — ONDANSETRON 8 MG PO TBDP: 8.0000 mg | ORAL_TABLET | Freq: Three times a day (TID) | ORAL | 0 refills | Status: AC | PRN

## 2023-03-02 MED ORDER — ACETAMINOPHEN 500 MG PO TABS: 500.0000 mg | ORAL_TABLET | Freq: Four times a day (QID) | ORAL | 0 refills | Status: DC | PRN

## 2023-03-02 MED ORDER — OXYCODONE-ACETAMINOPHEN 5-325 MG PO TABS
1.0000 | ORAL_TABLET | Freq: Once | ORAL | Status: AC
  Administered 2023-03-02: 1 via ORAL
  Filled 2023-03-02: qty 1

## 2023-03-02 MED ORDER — ONDANSETRON 8 MG PO TBDP: 8.0000 mg | ORAL_TABLET | Freq: Three times a day (TID) | ORAL | 0 refills | Status: DC | PRN

## 2023-03-02 NOTE — ED Provider Notes (Signed)
[facility] EMERG[facility]Y DEPARTMENT AT [facility] Provider Note   CSN: [id number] Arrival date & time: 03/02/23  1029     History  Chief Complaint  Patient presents with   Flank Pain    Julie Douglas is a 57 y.o. female.  HPI    57 year old female comes in with chief complaint of right-sided flank pain.  Patient has history of kidney stones, that required lithotripsy in the past.  She also has history of gastric bypass surgery.  Patient states that sometime in the middle night she woke up with severe pain on the right side.  She has associated nausea and vomiting.  The pain started getting worse, prompting her to call EMS.  She denies any burning with urination, blood in the urine, urinary frequency or urgency.  Per EMS, they gave patient 100 mics of fentanyl without any relief.  She has also received 4 mg of Zofran.  Patient denies any recent fevers or chills.  She was feeling well when she went to bed last night.  She has not had any complications from her bariatric surgery.  Home Medications Prior to Admission medications   Medication Sig Start Date End Date Taking? Authorizing Provider  acetaminophen (TYLENOL) 500 MG tablet Take 1 tablet (500 mg total) by mouth every 6 (six) hours as needed. 03/02/23  Yes Derwood Kaplan, MD  ibuprofen (ADVIL) 600 MG tablet Take 1 tablet (600 mg total) by mouth 3 (three) times daily. 03/02/23  Yes Kayleeann Huxford, MD  ondansetron (ZOFRAN-ODT) 8 MG disintegrating tablet Take 1 tablet (8 mg total) by mouth every 8 (eight) hours as needed for nausea. 03/02/23  Yes Derwood Kaplan, MD  oxyCODONE-acetaminophen (PERCOCET/ROXICET) 5-325 MG tablet Take 1 tablet by mouth every 8 (eight) hours as needed for severe pain. 03/02/23  Yes Derwood Kaplan, MD  apixaban (ELIQUIS) 2.5 MG TABS tablet Take 1 tablet (2.5 mg total) by mouth 2 (two) times daily. 11/30/17   Chadwell, Ivin Booty, PA-C  Calcium Citrate-Vitamin D (CELEBRATE CALCIUM CITRATE PO) Take 1 tablet by  mouth daily.    [provider]  cephALEXin (KEFLEX) 500 MG capsule Take 1 capsule (500 mg total) by mouth 3 (three) times daily. 06/19/18   Park Liter, DPM  Cholecalciferol (D-3-5) 5000 units capsule Take 5,000 Units by mouth daily. CELEBRATE D3 CHEWABLE.    [provider]  ciclopirox (PENLAC) 8 % solution Apply topically at bedtime. Apply over nail and surrounding skin. Apply daily over previous coat. After seven (7) days, may remove with alcohol and continue cycle. 06/23/22   Candelaria Stagers, DPM  ciprofloxacin (CIPRO) 500 MG tablet Take by mouth. 10/06/16   [provider]  ferrous sulfate 325 (65 FE) MG tablet Take 325 mg by mouth 2 (two) times a week.    [provider]  HYDROcodone-acetaminophen (NORCO/VICODIN) 5-325 MG tablet Take 1 tablet by mouth every 6 (six) hours as needed for moderate pain. 06/28/18   Lenn Sink, DPM  Liraglutide -Weight Management (SAXENDA) 18 MG/3ML SOPN Inject into the skin. 02/23/20   [provider]  lisinopril-hydrochlorothiazide (PRINZIDE,ZESTORETIC) 10-12.5 MG tablet Take 1 tablet by mouth daily.    [provider]  Multiple Vitamin (MULTI-VITAMINS) TABS Take by mouth.    [provider]  Multiple Vitamins-Minerals (CELEBRATE MULTI-COMPLETE 18) CAPS Take 1 capsule by mouth daily. CELEBRATE MULTI-ADEK CHEWABLE.    [provider]  omeprazole (PRILOSEC) 20 MG capsule Take 20 mg by mouth daily before breakfast.  [provider]  phentermine (ADIPEX-P) 37.5 MG tablet Take by mouth. 04/29/18   [provider]  promethazine (PHENERGAN) 25 MG tablet Take 1 tablet (25 mg total) by mouth every 8 (eight) hours as needed for nausea or vomiting. 06/19/18   Park Liter, DPM  tiZANidine (ZANAFLEX) 4 MG tablet Take 4 mg by mouth every 8 (eight) hours as needed for muscle spasms.    [provider]      Allergies    Sulfa antibiotics    Review of Systems   Review  of Systems  All other systems reviewed and are negative.   Physical Exam Updated Vital Signs BP 132/63   Pulse (!) 58   Temp 97.8 F (36.6 C) (Oral)   Resp 15   Ht 5\' 4"  (1.626 m)   Wt 100.2 kg   SpO2 98%   BMI 37.93 kg/m  Physical Exam Vitals and nursing note reviewed.  Constitutional:      Appearance: She is well-developed.  HENT:     Head: Atraumatic.  Cardiovascular:     Rate and Rhythm: Normal rate.  Pulmonary:     Effort: Pulmonary effort is normal.  Abdominal:     General: There is no distension.     Tenderness: There is abdominal tenderness. There is right CVA tenderness.  Musculoskeletal:     Cervical back: Normal range of motion and neck supple.  Skin:    General: Skin is warm and dry.  Neurological:     Mental Status: She is alert and oriented to person, place, and time.     ED Results / Procedures / Treatments   Labs (all labs ordered are listed, but only abnormal results are displayed) Labs Reviewed  BASIC METABOLIC PANEL - Abnormal; Notable for the following components:      Result Value   Potassium 3.2 (*)    Calcium 7.9 (*)    All other components within normal limits  CBC WITH DIFFERENTIAL/PLATELET  URINALYSIS, W/ REFLEX TO CULTURE (INFECTION SUSPECTED)    EKG None  Radiology US Renal  Result Date: 03/02/2023 CLINICAL DATA:  Right flank pain for evaluation of stone and hydronephrosis EXAM: RENAL / URINARY TRACT ULTRASOUND COMPLETE COMPARISON:  None Available. FINDINGS: Right Kidney: Length = 11.1 cm AP renal pelvis diameter = <10 mm Normal parenchymal echogenicity with preserved corticomedullary differentiation. Mild hydroureteronephrosis. Nonshadowing lower pole echogenic focus measures 5 mm. Upper pole simple cyst measures 2.2 cm. Mid and distal right ureter are not seen. Left Kidney: Length = 10.8 cm AP renal pelvis diameter = <10 mm Normal parenchymal echogenicity with preserved corticomedullary differentiation. No urinary tract dilation or  shadowing calculi. The ureter is not seen. Bladder: Appears normal for degree of bladder distention. Other: None. IMPRESSION: 1. Mild right hydroureteronephrosis involving the proximal ureter. Mid and distal right ureter are not seen. 2. A 5 mm nonshadowing echogenic focus in the lower pole of the right kidney may represent a nonshadowing stone. Electronically Signed   By: Agustin Cree M.D.   On: 03/02/2023 12:17    Procedures Procedures    Medications Ordered in ED Medications  ondansetron (ZOFRAN) injection 4 mg (4 mg Intravenous Given 03/02/23 1140)  HYDROmorphone (DILAUDID) injection 1 mg (1 mg Intravenous Given 03/02/23 1140)  ketorolac (TORADOL) 30 MG/ML injection 15 mg (15 mg Intravenous Given 03/02/23 1133)  oxyCODONE-acetaminophen (PERCOCET/ROXICET) 5-325 MG per tablet 1 tablet (1 tablet Oral Given 03/02/23 1342)    ED Course/ Medical Decision Making/ A&P Clinical Course  as of 03/02/23 1421  Fri Mar 02, 2023  1332 Results of the ER workup discussed with the patient.  Ultrasound is positive for right-sided hydro nephrosis, with likely a 5 mm stone.  Patient received Toradol and Dilaudid, she is a lot better now. UA pending.  We will give her oral Percocet for now. [AN]  1421 Patient remains pain-free for now.  She is already set up an appointment with her urologist.  She no longer takes blood thinner, which is reassuring.  UA pending, we are not concerned for infection clinically.  Stable for discharge. [AN]    Clinical Course User Index [AN] Derwood Kaplan, MD                             Medical Decision Making Amount and/or Complexity of Data Reviewed Labs: ordered. Radiology: ordered.  Risk OTC drugs. Prescription drug management.   57 year old patient comes in with chief complaint of sudden onset abdominal and flank pain.  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  Patient noted to be in significant discomfort at arrival.  She also  has abdominal discomfort in the right upper quadrant.  Differential diagnosis considered for this patient includes cholecystitis, kidney stone, perforated viscus, internal hernia, PUD.  Given the sudden onset nature of this pain, previous history of kidney stone my suspicion is high that this likely is kidney stone related issue.  Will start with basic labs, ultrasound kidney.  Patient has received IV fentanyl for pain, she is still uncomfortable.  Will order IV Toradol and as needed Dilaudid.  Final Clinical Impression(s) / ED Diagnoses Final diagnoses:  Ureteral colic    Rx / DC Orders ED Discharge Orders          Ordered    oxyCODONE-acetaminophen (PERCOCET/ROXICET) 5-325 MG tablet  Every 8 hours PRN        03/02/23 1419    ondansetron (ZOFRAN-ODT) 8 MG disintegrating tablet  Every 8 hours PRN        03/02/23 1419    ibuprofen (ADVIL) 600 MG tablet  3 times daily        03/02/23 1421    acetaminophen (TYLENOL) 500 MG tablet  Every 6 hours PRN        03/02/23 1421              Derwood Kaplan, MD 03/02/23 1421

## 2023-03-02 NOTE — Discharge Instructions (Addendum)
We saw you in the ER for the abdominal pain and back pain. Our results indicate that you have a kidney stone. We were able to get your pain is relative control, and we can safely send you home.  Take the meds prescribed. Set up an appointment with the Urologist. If the pain is unbearable, you start having fevers, chills, and are unable to keep any meds down - then return to the ER.

## 2023-03-02 NOTE — ED Triage Notes (Signed)
Pt present to ED from home with c/o right flank pain, n/v onset yesterday. Pt states hx of kidney stones. Pt received zofran 4mg  IV, fentanyl IV. Pt A&Ox4 at this time, continue to c/o right flank pain.

## 2024-01-09 ENCOUNTER — Ambulatory Visit (INDEPENDENT_AMBULATORY_CARE_PROVIDER_SITE_OTHER): Payer: 59 | Admitting: Podiatry

## 2024-01-09 DIAGNOSIS — B351 Tinea unguium: Secondary | ICD-10-CM | POA: Diagnosis not present

## 2024-01-09 DIAGNOSIS — M79675 Pain in left toe(s): Secondary | ICD-10-CM | POA: Diagnosis not present

## 2024-01-09 DIAGNOSIS — M79674 Pain in right toe(s): Secondary | ICD-10-CM | POA: Diagnosis not present

## 2024-01-09 NOTE — Progress Notes (Signed)
  Subjective:  Patient ID: Julie Douglas, female    DOB: [birth year],  MRN: [id number]  Chief Complaint  Patient presents with   Callouses   58 y.o. female returns for the above complaint.  Patient presents with thickened and onychodystrophy mycotic toenails x 10 mild pain on palpation hurts with ambulation is with pressure patient would like to debride it down.  Objective:  There were no vitals filed for this visit. Podiatric Exam: Vascular: dorsalis pedis and posterior tibial pulses are palpable bilateral. Capillary return is immediate. Temperature gradient is WNL. Skin turgor WNL  Sensorium: Normal Semmes Weinstein monofilament test. Normal tactile sensation bilaterally. Nail Exam: Pt has thick disfigured discolored nails with subungual debris noted bilateral entire nail hallux through fifth toenails.  Pain on palpation to the nails. Ulcer Exam: There is no evidence of ulcer or pre-ulcerative changes or infection. Orthopedic Exam: Muscle tone and strength are WNL. No limitations in general ROM. No crepitus or effusions noted.  Skin: No Porokeratosis. No infection or ulcers    Assessment & Plan:   1. Pain due to onychomycosis of toenails of both feet     Patient was evaluated and treated and all questions answered.  Onychomycosis with pain  -Nails palliatively debrided as below. -Educated on self-care  Procedure: Nail Debridement Rationale: pain  Type of Debridement: manual, sharp debridement. Instrumentation: Nail nipper, rotary burr. Number of Nails: 10  Procedures and Treatment: Consent by patient was obtained for treatment procedures. The patient understood the discussion of treatment and procedures well. All questions were answered thoroughly reviewed. Debridement of mycotic and hypertrophic toenails, 1 through 5 bilateral and clearing of subungual debris. No ulceration, no infection noted.  Return Visit-Office Procedure: Patient instructed to return to the office for a  follow up visit 3 months for continued evaluation and treatment.  Nicholes Rough, DPM    No follow-ups on file.
